# Patient Record
Sex: Male | Born: 1960 | Race: Black or African American | Hispanic: No | Marital: Single | Smoking: Never smoker
Health system: Southern US, Community
[De-identification: ages and names within clinical notes are randomized; demographics above are authoritative.]

## PROBLEM LIST (undated history)

## (undated) DIAGNOSIS — K259 Gastric ulcer, unspecified as acute or chronic, without hemorrhage or perforation: Secondary | ICD-10-CM

## (undated) DIAGNOSIS — I82409 Acute embolism and thrombosis of unspecified deep veins of unspecified lower extremity: Secondary | ICD-10-CM

## (undated) DIAGNOSIS — K219 Gastro-esophageal reflux disease without esophagitis: Secondary | ICD-10-CM

## (undated) DIAGNOSIS — R519 Headache, unspecified: Secondary | ICD-10-CM

## (undated) DIAGNOSIS — I10 Essential (primary) hypertension: Secondary | ICD-10-CM

## (undated) DIAGNOSIS — R51 Headache: Secondary | ICD-10-CM

## (undated) DIAGNOSIS — M1990 Unspecified osteoarthritis, unspecified site: Secondary | ICD-10-CM

## (undated) HISTORY — PX: KNEE ARTHROSCOPY: SUR90

---

## 2015-06-15 DIAGNOSIS — I82401 Acute embolism and thrombosis of unspecified deep veins of right lower extremity: Secondary | ICD-10-CM

## 2015-06-15 HISTORY — DX: Acute embolism and thrombosis of unspecified deep veins of right lower extremity: I82.401

## 2015-11-24 ENCOUNTER — Other Ambulatory Visit: Payer: Self-pay | Admitting: Physician Assistant

## 2015-11-26 NOTE — Patient Instructions (Addendum)
[name] [name]  11/26/2015   Your procedure is scheduled on: 12-05-15   [facility]  [facility] 3rd floor to  Short Stay Center at   1000 AM.  Call this number if you have problems the morning of surgery [phone]   Remember: ONLY 1 PERSON MAY GO WITH YOU TO SHORT STAY TO GET  READY MORNING OF YOUR SURGERY.  Do not eat food or drink liquids :After Midnight.     Take these medicines the morning of surgery with A SIP OF WATER: Pantoprazole. Pravastatin. Cetirizine. DO NOT TAKE ANY DIABETIC MEDICATIONS DAY OF YOUR SURGERY                               You may not have any metal on your body including hair pins and              piercings  Do not wear jewelry, make-up, lotions, powders or perfumes, deodorant             Do not wear nail polish.  Do not shave  48 hours prior to surgery.              Men may shave face and neck.   Do not bring valuables to the hospital. [facility] IS NOT             RESPONSIBLE   FOR VALUABLES.  Contacts, dentures or bridgework may not be worn into surgery.  Leave suitcase in the car. After surgery it may be brought to your room.     Patients discharged the day of surgery will not be allowed to drive home.  Name and phone number of your driver: Amelia JoVirgil [name]-father [phone] home  Special Instructions: N/A              Please read over the following fact sheets you were given: _____________________________________________________________________             [facility] - Preparing [facility] Before surgery, you can play an important role.  Because skin is not sterile, your skin needs to be as free of germs as possible.  You can reduce the number of germs on your skin by washing with CHG (chlorahexidine gluconate) soap before surgery.  CHG is an antiseptic cleaner which kills germs and bonds with the skin to continue killing germs even after washing. Please DO NOT use if you have an  allergy to CHG or antibacterial soaps.  If your skin becomes reddened/irritated stop using the CHG and inform your nurse when you arrive at Short Stay. Do not shave (including legs and underarms) for at least 48 hours prior to the first CHG shower.  You may shave your face/neck. Please follow these instructions carefully:  1.  Shower with CHG Soap the night before surgery and the  morning of Surgery.  2.  If you choose to wash your hair, wash your hair first as usual with your  normal  shampoo.  3.  After you shampoo, rinse your hair and body thoroughly to remove the  shampoo.                           4.  Use CHG as you would any other liquid soap.  You can apply chg directly  to the skin and wash                       Gently with a scrungie or clean washcloth.  5.  Apply the CHG Soap to your body ONLY FROM THE NECK DOWN.   Do not use on face/ open                           Wound or open sores. Avoid contact with eyes, ears mouth and genitals (private parts).                       Wash face,  Genitals (private parts) with your normal soap.             6.  Wash thoroughly, paying special attention to the area where your surgery  will be performed.  7.  Thoroughly rinse your body with warm water from the neck down.  8.  DO NOT shower/wash with your normal soap after using and rinsing off  the CHG Soap.                9.  Pat yourself dry with a clean towel.            10.  Wear clean pajamas.            11.  Place clean sheets on your bed the night of your first shower and do not  sleep with pets. Day of Surgery : Do not apply any lotions/deodorants the morning of surgery.  Please wear clean clothes to the hospital/surgery center.  FAILURE TO FOLLOW THESE INSTRUCTIONS MAY RESULT IN THE CANCELLATION OF YOUR SURGERY PATIENT SIGNATURE_________________________________  NURSE  SIGNATURE__________________________________  ________________________________________________________________________   [name]  An incentive spirometer is a tool that can help keep your lungs clear and active. This tool measures how well you are filling your lungs with each breath. Taking long deep breaths may help reverse or decrease the chance of developing breathing (pulmonary) problems (especially infection) following:  A long period of time when you are unable to move or be active. BEFORE THE PROCEDURE   If the spirometer includes an indicator to show your best effort, your nurse or respiratory therapist will set it to a desired goal.  If possible, sit up straight or lean slightly forward. Try not to slouch.  Hold the incentive spirometer in an upright position. INSTRUCTIONS FOR USE  1. Sit on the edge of your bed if possible, or sit up as far as you can in bed or on a chair. 2. Hold the incentive spirometer in an upright position. 3. Breathe out normally. 4. Place the mouthpiece in your mouth and seal your lips tightly around it. 5. Breathe in slowly and as deeply as possible, raising the piston or the ball toward the top of the column. 6. Hold your breath for 3-5 seconds or for as long as possible. Allow the piston or ball to fall to the bottom of the column. 7. Remove the mouthpiece from your mouth and breathe out normally. 8. Rest for a few seconds and repeat Steps 1 through 7 at least 10 times every 1-2 hours when you are awake. Take your time and take a few normal breaths between deep breaths. 9. The spirometer may include an indicator to show your best effort. Use the indicator as a goal to work toward during each repetition. 10. After each  set of 10 deep breaths, practice coughing to be sure your lungs are clear. If you have an incision (the cut made at the time of surgery), support your incision when coughing by placing a pillow or rolled up towels firmly  against it. Once you are able to get out of bed, walk around indoors and cough well. You may stop using the incentive spirometer when instructed by your caregiver.  RISKS AND COMPLICATIONS  Take your time so you do not get dizzy or light-headed.  If you are in pain, you may need to take or ask for pain medication before doing incentive spirometry. It is harder to take a deep breath if you are having pain. AFTER USE  Rest and breathe slowly and easily.  It can be helpful to keep track of a log of your progress. Your caregiver can provide you with a simple table to help with this. If you are using the spirometer at home, follow these instructions: [place] IF:   You are having difficultly using the spirometer.  You have trouble using the spirometer as often as instructed.  Your pain medication is not giving enough relief while using the spirometer.  You develop fever of 100.5 F (38.1 C) or higher. SEEK IMMEDIATE MEDICAL CARE IF:   You cough up bloody sputum that had not been present before.  You develop fever of 102 F (38.9 C) or greater.  You develop worsening pain at or near the incision site. MAKE SURE YOU:   Understand these instructions.  Will watch your condition.  Will get help right away if you are not doing well or get worse. Document Released: 10/11/2006 Document Revised: 08/23/2011 Document Reviewed: 12/12/2006 [facility] Patient Information 2014 [place], [place].   ________________________________________________________________________

## 2015-11-28 ENCOUNTER — Encounter (HOSPITAL_COMMUNITY)
Admission: RE | Admit: 2015-11-28 | Discharge: 2015-11-28 | Disposition: A | Discharge status: Home / Self Care | Payer: Managed Care, Other (non HMO) | Source: Ambulatory Visit | Attending: Orthopaedic Surgery | Admitting: Orthopaedic Surgery

## 2015-11-28 ENCOUNTER — Encounter (HOSPITAL_COMMUNITY): Payer: Self-pay

## 2015-11-28 DIAGNOSIS — Z0183 Encounter for blood typing: Secondary | ICD-10-CM | POA: Diagnosis not present

## 2015-11-28 DIAGNOSIS — M1611 Unilateral primary osteoarthritis, right hip: Secondary | ICD-10-CM | POA: Insufficient documentation

## 2015-11-28 DIAGNOSIS — I10 Essential (primary) hypertension: Secondary | ICD-10-CM | POA: Diagnosis not present

## 2015-11-28 DIAGNOSIS — Z01818 Encounter for other preprocedural examination: Secondary | ICD-10-CM | POA: Insufficient documentation

## 2015-11-28 DIAGNOSIS — Z01812 Encounter for preprocedural laboratory examination: Secondary | ICD-10-CM | POA: Diagnosis not present

## 2015-11-28 HISTORY — DX: Headache, unspecified: R51.9

## 2015-11-28 HISTORY — DX: Acute embolism and thrombosis of unspecified deep veins of unspecified lower extremity: I82.409

## 2015-11-28 HISTORY — DX: Essential (primary) hypertension: I10

## 2015-11-28 HISTORY — DX: Gastro-esophageal reflux disease without esophagitis: K21.9

## 2015-11-28 HISTORY — DX: Headache: R51

## 2015-11-28 HISTORY — DX: Gastric ulcer, unspecified as acute or chronic, without hemorrhage or perforation: K25.9

## 2015-11-28 HISTORY — DX: Unspecified osteoarthritis, unspecified site: M19.90

## 2015-11-28 LAB — BASIC METABOLIC PANEL
Anion gap: 6 (ref 5–15)
BUN: 11 mg/dL (ref 6–20)
CALCIUM: 9 mg/dL (ref 8.9–10.3)
CHLORIDE: 108 mmol/L (ref 101–111)
CO2: 25 mmol/L (ref 22–32)
CREATININE: 1.33 mg/dL — AB (ref 0.61–1.24)
GFR calc Af Amer: >60 mL/min (ref >60)
GFR calc non Af Amer: 60 mL/min — ABNORMAL LOW (ref >60)
Glucose, Bld: 96 mg/dL (ref 65–99)
Potassium: 4.2 mmol/L (ref 3.5–5.1)
SODIUM: 139 mmol/L (ref 135–145)

## 2015-11-28 LAB — SURGICAL PCR SCREEN
MRSA, PCR: NEGATIVE
Staphylococcus aureus: NEGATIVE

## 2015-11-28 LAB — CBC
HCT: 46 % (ref 39.0–52.0)
Hemoglobin: 15.8 g/dL (ref 13.0–17.0)
MCH: 27.1 pg (ref 26.0–34.0)
MCHC: 34.3 g/dL (ref 30.0–36.0)
MCV: 79 fL (ref 78.0–100.0)
PLATELETS: 200 10*3/uL (ref 150–400)
RBC: 5.82 MIL/uL — ABNORMAL HIGH (ref 4.22–5.81)
RDW: 14 % (ref 11.5–15.5)
WBC: 5 10*3/uL (ref 4.0–10.5)

## 2015-11-28 LAB — ABO/RH: ABO/RH(D): A POS

## 2015-11-28 NOTE — Pre-Procedure Instructions (Signed)
EKG done today.

## 2015-12-05 ENCOUNTER — Inpatient Hospital Stay (HOSPITAL_COMMUNITY): Payer: Managed Care, Other (non HMO)

## 2015-12-05 ENCOUNTER — Inpatient Hospital Stay (HOSPITAL_COMMUNITY): Payer: Managed Care, Other (non HMO) | Admitting: Registered Nurse

## 2015-12-05 ENCOUNTER — Encounter (HOSPITAL_COMMUNITY): Payer: Self-pay | Admitting: Anesthesiology

## 2015-12-05 ENCOUNTER — Encounter (HOSPITAL_COMMUNITY)
Admission: RE | Disposition: A | Discharge status: Home Health | Payer: Self-pay | Source: Ambulatory Visit | Attending: Orthopaedic Surgery

## 2015-12-05 ENCOUNTER — Inpatient Hospital Stay (HOSPITAL_COMMUNITY)
Admission: RE | Admit: 2015-12-05 | Discharge: 2015-12-06 | DRG: 470 | Disposition: A | Discharge status: Home Health | Payer: Managed Care, Other (non HMO) | Source: Ambulatory Visit | Attending: Orthopaedic Surgery | Admitting: Orthopaedic Surgery

## 2015-12-05 DIAGNOSIS — Z6835 Body mass index (BMI) 35.0-35.9, adult: Secondary | ICD-10-CM

## 2015-12-05 DIAGNOSIS — Z79899 Other long term (current) drug therapy: Secondary | ICD-10-CM

## 2015-12-05 DIAGNOSIS — M1611 Unilateral primary osteoarthritis, right hip: Secondary | ICD-10-CM | POA: Diagnosis present

## 2015-12-05 DIAGNOSIS — Z96641 Presence of right artificial hip joint: Secondary | ICD-10-CM

## 2015-12-05 DIAGNOSIS — K219 Gastro-esophageal reflux disease without esophagitis: Secondary | ICD-10-CM | POA: Diagnosis present

## 2015-12-05 DIAGNOSIS — Z8711 Personal history of peptic ulcer disease: Secondary | ICD-10-CM | POA: Diagnosis not present

## 2015-12-05 DIAGNOSIS — E669 Obesity, unspecified: Secondary | ICD-10-CM | POA: Diagnosis present

## 2015-12-05 DIAGNOSIS — I10 Essential (primary) hypertension: Secondary | ICD-10-CM | POA: Diagnosis present

## 2015-12-05 DIAGNOSIS — E785 Hyperlipidemia, unspecified: Secondary | ICD-10-CM | POA: Diagnosis present

## 2015-12-05 DIAGNOSIS — M25551 Pain in right hip: Secondary | ICD-10-CM | POA: Diagnosis present

## 2015-12-05 DIAGNOSIS — Z419 Encounter for procedure for purposes other than remedying health state, unspecified: Secondary | ICD-10-CM

## 2015-12-05 HISTORY — PX: TOTAL HIP ARTHROPLASTY: SHX124

## 2015-12-05 LAB — TYPE AND SCREEN
ABO/RH(D): A POS
Antibody Screen: NEGATIVE

## 2015-12-05 SURGERY — ARTHROPLASTY, HIP, TOTAL, ANTERIOR APPROACH
Anesthesia: Spinal | Site: Hip | Laterality: Right

## 2015-12-05 MED ORDER — METOCLOPRAMIDE HCL 5 MG/ML IJ SOLN: 5.0000 mg | Freq: Three times a day (TID) | INTRAMUSCULAR | Status: DC | PRN

## 2015-12-05 MED ORDER — ACETAMINOPHEN 325 MG PO TABS
650.0000 mg | ORAL_TABLET | Freq: Four times a day (QID) | ORAL | Status: DC | PRN
Start: 2015-12-05 — End: 2015-12-06
  Administered 2015-12-05: 650 mg via ORAL
  Filled 2015-12-05: qty 2

## 2015-12-05 MED ORDER — PRAVASTATIN SODIUM 20 MG PO TABS
40.0000 mg | ORAL_TABLET | Freq: Every day | ORAL | Status: DC
  Administered 2015-12-06: 40 mg via ORAL
  Filled 2015-12-05: qty 2

## 2015-12-05 MED ORDER — MENTHOL 3 MG MT LOZG: 1.0000 | LOZENGE | OROMUCOSAL | Status: DC | PRN

## 2015-12-05 MED ORDER — METHOCARBAMOL 1000 MG/10ML IJ SOLN
500.0000 mg | Freq: Four times a day (QID) | INTRAVENOUS | Status: DC | PRN
  Administered 2015-12-05: 500 mg via INTRAVENOUS
  Filled 2015-12-05: qty 5
  Filled 2015-12-05: qty 550

## 2015-12-05 MED ORDER — DEXAMETHASONE SODIUM PHOSPHATE 10 MG/ML IJ SOLN
INTRAMUSCULAR | Status: AC
  Filled 2015-12-05: qty 1

## 2015-12-05 MED ORDER — CEFAZOLIN SODIUM-DEXTROSE 2-4 GM/100ML-% IV SOLN
INTRAVENOUS | Status: AC
  Filled 2015-12-05: qty 100

## 2015-12-05 MED ORDER — SODIUM CHLORIDE 0.9 % IV SOLN
INTRAVENOUS | Status: DC
  Administered 2015-12-05: 17:00:00 via INTRAVENOUS

## 2015-12-05 MED ORDER — PROPOFOL 10 MG/ML IV BOLUS
INTRAVENOUS | Status: AC
  Filled 2015-12-05: qty 20

## 2015-12-05 MED ORDER — CEFAZOLIN SODIUM-DEXTROSE 2-4 GM/100ML-% IV SOLN
2.0000 g | INTRAVENOUS | Status: AC
  Administered 2015-12-05: 2 g via INTRAVENOUS
  Filled 2015-12-05: qty 100

## 2015-12-05 MED ORDER — KETOROLAC TROMETHAMINE 30 MG/ML IJ SOLN
30.0000 mg | Freq: Once | INTRAMUSCULAR | Status: AC
Start: 2015-12-05 — End: 2015-12-05
  Administered 2015-12-05: 30 mg via INTRAVENOUS
  Filled 2015-12-05: qty 1

## 2015-12-05 MED ORDER — PHENYLEPHRINE HCL 10 MG/ML IJ SOLN
20.0000 mg | INTRAVENOUS | Status: DC | PRN
  Administered 2015-12-05: 15 ug/min via INTRAVENOUS

## 2015-12-05 MED ORDER — CELECOXIB 200 MG PO CAPS
200.0000 mg | ORAL_CAPSULE | Freq: Two times a day (BID) | ORAL | Status: DC
  Administered 2015-12-05 – 2015-12-06 (×2): 200 mg via ORAL
  Filled 2015-12-05 (×2): qty 1

## 2015-12-05 MED ORDER — METOCLOPRAMIDE HCL 5 MG PO TABS: 5.0000 mg | ORAL_TABLET | Freq: Three times a day (TID) | ORAL | Status: DC | PRN

## 2015-12-05 MED ORDER — ONDANSETRON HCL 4 MG/2ML IJ SOLN: 4.0000 mg | Freq: Once | INTRAMUSCULAR | Status: DC | PRN

## 2015-12-05 MED ORDER — ALUM & MAG HYDROXIDE-SIMETH 200-200-20 MG/5ML PO SUSP: 30.0000 mL | ORAL | Status: DC | PRN

## 2015-12-05 MED ORDER — OXYCODONE HCL 5 MG PO TABS
5.0000 mg | ORAL_TABLET | ORAL | Status: DC | PRN
  Administered 2015-12-05 – 2015-12-06 (×3): 10 mg via ORAL
  Filled 2015-12-05 (×3): qty 2

## 2015-12-05 MED ORDER — RIVAROXABAN 10 MG PO TABS
10.0000 mg | ORAL_TABLET | Freq: Every day | ORAL | Status: DC
  Administered 2015-12-06: 10 mg via ORAL
  Filled 2015-12-05: qty 1

## 2015-12-05 MED ORDER — CEFAZOLIN IN D5W 1 GM/50ML IV SOLN
1.0000 g | Freq: Four times a day (QID) | INTRAVENOUS | Status: AC
  Administered 2015-12-05 – 2015-12-06 (×2): 1 g via INTRAVENOUS
  Filled 2015-12-05 (×2): qty 50

## 2015-12-05 MED ORDER — STERILE WATER FOR IRRIGATION IR SOLN
Status: DC | PRN
  Administered 2015-12-05: 2000 mL

## 2015-12-05 MED ORDER — ONDANSETRON HCL 4 MG/2ML IJ SOLN
4.0000 mg | Freq: Four times a day (QID) | INTRAMUSCULAR | Status: DC | PRN
Start: 2015-12-05 — End: 2015-12-06

## 2015-12-05 MED ORDER — HYDROMORPHONE HCL 1 MG/ML IJ SOLN
1.0000 mg | INTRAMUSCULAR | Status: DC | PRN
  Administered 2015-12-05: 1 mg via INTRAVENOUS
  Filled 2015-12-05: qty 1

## 2015-12-05 MED ORDER — FENTANYL CITRATE (PF) 100 MCG/2ML IJ SOLN
INTRAMUSCULAR | Status: DC | PRN
  Administered 2015-12-05: 50 ug via INTRAVENOUS

## 2015-12-05 MED ORDER — PHENOL 1.4 % MT LIQD: 1.0000 | OROMUCOSAL | Status: DC | PRN

## 2015-12-05 MED ORDER — DOCUSATE SODIUM 100 MG PO CAPS
100.0000 mg | ORAL_CAPSULE | Freq: Two times a day (BID) | ORAL | Status: DC
  Administered 2015-12-05 – 2015-12-06 (×3): 100 mg via ORAL
  Filled 2015-12-05 (×3): qty 1

## 2015-12-05 MED ORDER — SODIUM CHLORIDE 0.9 % IR SOLN
Status: DC | PRN
  Administered 2015-12-05: 1000 mL

## 2015-12-05 MED ORDER — DIPHENHYDRAMINE HCL 12.5 MG/5ML PO ELIX: 12.5000 mg | ORAL_SOLUTION | ORAL | Status: DC | PRN

## 2015-12-05 MED ORDER — LIDOCAINE HCL (CARDIAC) 20 MG/ML IV SOLN
INTRAVENOUS | Status: DC | PRN
  Administered 2015-12-05: 100 mg via INTRAVENOUS

## 2015-12-05 MED ORDER — LIDOCAINE HCL (CARDIAC) 20 MG/ML IV SOLN
INTRAVENOUS | Status: AC
  Filled 2015-12-05: qty 5

## 2015-12-05 MED ORDER — BUPIVACAINE IN DEXTROSE 0.75-8.25 % IT SOLN
INTRATHECAL | Status: DC | PRN
  Administered 2015-12-05: 2 mL via INTRATHECAL

## 2015-12-05 MED ORDER — METHOCARBAMOL 500 MG PO TABS
500.0000 mg | ORAL_TABLET | Freq: Four times a day (QID) | ORAL | Status: DC | PRN
  Administered 2015-12-05: 500 mg via ORAL
  Filled 2015-12-05: qty 1

## 2015-12-05 MED ORDER — DEXAMETHASONE SODIUM PHOSPHATE 10 MG/ML IJ SOLN
INTRAMUSCULAR | Status: DC | PRN
Start: 2015-12-05 — End: 2015-12-05
  Administered 2015-12-05: 10 mg via INTRAVENOUS

## 2015-12-05 MED ORDER — EPHEDRINE SULFATE 50 MG/ML IJ SOLN
INTRAMUSCULAR | Status: DC | PRN
  Administered 2015-12-05 (×2): 5 mg via INTRAVENOUS

## 2015-12-05 MED ORDER — LACTATED RINGERS IV SOLN
INTRAVENOUS | Status: DC
  Administered 2015-12-05: 11:00:00 via INTRAVENOUS

## 2015-12-05 MED ORDER — PHENYLEPHRINE HCL 10 MG/ML IJ SOLN
INTRAMUSCULAR | Status: AC
  Filled 2015-12-05: qty 2

## 2015-12-05 MED ORDER — PROPOFOL 500 MG/50ML IV EMUL
INTRAVENOUS | Status: DC | PRN
  Administered 2015-12-05: 40 ug/kg/min via INTRAVENOUS

## 2015-12-05 MED ORDER — ZOLPIDEM TARTRATE 5 MG PO TABS: 5.0000 mg | ORAL_TABLET | Freq: Every evening | ORAL | Status: DC | PRN

## 2015-12-05 MED ORDER — PHENYLEPHRINE HCL 10 MG/ML IJ SOLN
INTRAMUSCULAR | Status: DC | PRN
  Administered 2015-12-05 (×3): 80 ug via INTRAVENOUS

## 2015-12-05 MED ORDER — PROPOFOL 10 MG/ML IV BOLUS
INTRAVENOUS | Status: AC
  Filled 2015-12-05: qty 40

## 2015-12-05 MED ORDER — FENTANYL CITRATE (PF) 100 MCG/2ML IJ SOLN
INTRAMUSCULAR | Status: AC
  Filled 2015-12-05: qty 2

## 2015-12-05 MED ORDER — LOSARTAN POTASSIUM 25 MG PO TABS
25.0000 mg | ORAL_TABLET | Freq: Every day | ORAL | Status: DC
  Administered 2015-12-06: 25 mg via ORAL
  Filled 2015-12-05 (×3): qty 1

## 2015-12-05 MED ORDER — ONDANSETRON HCL 4 MG/2ML IJ SOLN
INTRAMUSCULAR | Status: AC
  Filled 2015-12-05: qty 2

## 2015-12-05 MED ORDER — PANTOPRAZOLE SODIUM 40 MG PO TBEC
40.0000 mg | DELAYED_RELEASE_TABLET | Freq: Every day | ORAL | Status: DC
  Administered 2015-12-06: 40 mg via ORAL
  Filled 2015-12-05: qty 1

## 2015-12-05 MED ORDER — CHLORHEXIDINE GLUCONATE 4 % EX LIQD: 60.0000 mL | Freq: Once | CUTANEOUS | Status: DC

## 2015-12-05 MED ORDER — ONDANSETRON HCL 4 MG PO TABS: 4.0000 mg | ORAL_TABLET | Freq: Four times a day (QID) | ORAL | Status: DC | PRN

## 2015-12-05 MED ORDER — MIDAZOLAM HCL 5 MG/5ML IJ SOLN
INTRAMUSCULAR | Status: DC | PRN
  Administered 2015-12-05: 2 mg via INTRAVENOUS

## 2015-12-05 MED ORDER — OXYCODONE HCL 5 MG PO TABS
5.0000 mg | ORAL_TABLET | ORAL | Status: DC | PRN
  Administered 2015-12-05: 10 mg via ORAL
  Filled 2015-12-05: qty 2

## 2015-12-05 MED ORDER — ACETAMINOPHEN 650 MG RE SUPP: 650.0000 mg | Freq: Four times a day (QID) | RECTAL | Status: DC | PRN

## 2015-12-05 MED ORDER — MIDAZOLAM HCL 2 MG/2ML IJ SOLN
INTRAMUSCULAR | Status: AC
  Filled 2015-12-05: qty 2

## 2015-12-05 MED ORDER — ONDANSETRON HCL 4 MG/2ML IJ SOLN
INTRAMUSCULAR | Status: DC | PRN
  Administered 2015-12-05: 4 mg via INTRAVENOUS

## 2015-12-05 MED ORDER — HYDROMORPHONE HCL 1 MG/ML IJ SOLN: 0.2500 mg | INTRAMUSCULAR | Status: DC | PRN

## 2015-12-05 MED ORDER — PHENYLEPHRINE 40 MCG/ML (10ML) SYRINGE FOR IV PUSH (FOR BLOOD PRESSURE SUPPORT)
PREFILLED_SYRINGE | INTRAVENOUS | Status: AC
  Filled 2015-12-05: qty 10

## 2015-12-05 MED ORDER — MEPERIDINE HCL 50 MG/ML IJ SOLN: 6.2500 mg | INTRAMUSCULAR | Status: DC | PRN

## 2015-12-05 SURGICAL SUPPLY — 41 items
BAG ZIPLOCK 12X15 (MISCELLANEOUS) ×3 IMPLANT
BENZOIN TINCTURE PRP APPL 2/3 (GAUZE/BANDAGES/DRESSINGS) ×3 IMPLANT
BLADE SAW SGTL 18X1.27X75 (BLADE) ×2 IMPLANT
BLADE SAW SGTL 18X1.27X75MM (BLADE) ×1
CAPT HIP TOTAL 2 ×3 IMPLANT
CELLS DAT CNTRL 66122 CELL SVR (MISCELLANEOUS) ×1 IMPLANT
CLOSURE WOUND 1/2 X4 (GAUZE/BANDAGES/DRESSINGS) ×1
CLOTH BEACON ORANGE TIMEOUT ST (SAFETY) ×3 IMPLANT
DRAPE STERI IOBAN 125X83 (DRAPES) ×3 IMPLANT
DRAPE U-SHAPE 47X51 STRL (DRAPES) ×6 IMPLANT
DRESSING AQUACEL AG SP 3.5X10 (GAUZE/BANDAGES/DRESSINGS) ×1 IMPLANT
DRSG AQUACEL AG ADV 3.5X10 (GAUZE/BANDAGES/DRESSINGS) ×3 IMPLANT
DRSG AQUACEL AG SP 3.5X10 (GAUZE/BANDAGES/DRESSINGS) ×3
DURAPREP 26ML APPLICATOR (WOUND CARE) ×3 IMPLANT
ELECT REM PT RETURN 9FT ADLT (ELECTROSURGICAL) ×3
ELECTRODE REM PT RTRN 9FT ADLT (ELECTROSURGICAL) ×1 IMPLANT
GAUZE XEROFORM 1X8 LF (GAUZE/BANDAGES/DRESSINGS) IMPLANT
GLOVE BIO SURGEON STRL SZ7.5 (GLOVE) ×3 IMPLANT
GLOVE BIOGEL PI IND STRL 7.5 (GLOVE) ×4 IMPLANT
GLOVE BIOGEL PI IND STRL 8 (GLOVE) ×2 IMPLANT
GLOVE BIOGEL PI INDICATOR 7.5 (GLOVE) ×8
GLOVE BIOGEL PI INDICATOR 8 (GLOVE) ×4
GLOVE ECLIPSE 8.0 STRL XLNG CF (GLOVE) ×3 IMPLANT
GLOVE SURG SS PI 7.5 STRL IVOR (GLOVE) ×6 IMPLANT
GOWN STRL REUS W/ TWL XL LVL3 (GOWN DISPOSABLE) ×1 IMPLANT
GOWN STRL REUS W/TWL XL LVL3 (GOWN DISPOSABLE) ×11 IMPLANT
HANDPIECE INTERPULSE COAX TIP (DISPOSABLE) ×2
HOLDER FOLEY CATH W/STRAP (MISCELLANEOUS) ×3 IMPLANT
PACK ANTERIOR HIP CUSTOM (KITS) ×3 IMPLANT
RTRCTR WOUND ALEXIS 18CM MED (MISCELLANEOUS) ×3
SET HNDPC FAN SPRY TIP SCT (DISPOSABLE) ×1 IMPLANT
STAPLER VISISTAT 35W (STAPLE) IMPLANT
STRIP CLOSURE SKIN 1/2X4 (GAUZE/BANDAGES/DRESSINGS) ×2 IMPLANT
SUT ETHIBOND NAB CT1 #1 30IN (SUTURE) ×3 IMPLANT
SUT MNCRL AB 4-0 PS2 18 (SUTURE) ×3 IMPLANT
SUT VIC AB 0 CT1 36 (SUTURE) ×3 IMPLANT
SUT VIC AB 1 CT1 36 (SUTURE) ×3 IMPLANT
SUT VIC AB 2-0 CT1 27 (SUTURE) ×4
SUT VIC AB 2-0 CT1 TAPERPNT 27 (SUTURE) ×2 IMPLANT
TRAY CATH 16FR W/PLASTIC CATH (SET/KITS/TRAYS/PACK) ×3 IMPLANT
YANKAUER SUCT BULB TIP NO VENT (SUCTIONS) ×3 IMPLANT

## 2015-12-05 NOTE — Anesthesia Postprocedure Evaluation (Addendum)
Anesthesia Post Note  Patient: [name] DandyGregory [name]  Procedure(s) Performed: Procedure(s) (LRB): RIGHT TOTAL HIP ARTHROPLASTY ANTERIOR APPROACH (Right)  Patient location during evaluation: PACU Anesthesia Type: Spinal Level of consciousness: awake and alert and oriented Pain management: pain level controlled Vital Signs Assessment: post-procedure vital signs reviewed and stable Respiratory status: spontaneous breathing, nonlabored ventilation and respiratory function stable Cardiovascular status: blood pressure returned to baseline and stable Postop Assessment: no signs of nausea or vomiting, no headache, no backache, spinal receding and patient able to bend at knees Anesthetic complications: no    Last Vitals:  Filed Vitals:   12/05/15 1445 12/05/15 1500  BP: 108/73 106/71  Pulse: 73 77  Temp:  36.7 C  Resp: 12 15    Last Pain:  Filed Vitals:   12/05/15 1513  PainSc: Asleep                 [name]

## 2015-12-05 NOTE — Brief Op Note (Signed)
12/05/2015  1:16 PM  PATIENT:  [name] [name]  55 y.o. male  PRE-OPERATIVE DIAGNOSIS:  severe osteoarthritis right hip  POST-OPERATIVE DIAGNOSIS:  severe osteoarthritis right hip  PROCEDURE:  Procedure(s): RIGHT TOTAL HIP ARTHROPLASTY ANTERIOR APPROACH (Right)  SURGEON:  Surgeon(s) and Role:    * Kathryne Hitchhristopher Y Alvah Gilder, MD - Primary  PHYSICIAN ASSISTANT: Rexene EdisonGil Clark, PA-C  ANESTHESIA:   spinal  EBL:  Total I/O In: 1000 [I.V.:1000] Out: 325 [Blood:325]  COUNTS:  YES  DICTATION: .Other Dictation: Dictation Number [id number]  PLAN OF CARE: Admit to inpatient   PATIENT DISPOSITION:  PACU - hemodynamically stable.   Delay start of Pharmacological VTE agent (>24hrs) due to surgical blood loss or risk of bleeding: no

## 2015-12-05 NOTE — H&P (Signed)
TOTAL HIP ADMISSION H&P  Patient is admitted for right total hip arthroplasty.  Subjective:  Chief Complaint: right hip pain  HPI: [name] [name], 55 y.o. male, has a history of pain and functional disability in the right hip(s) due to arthritis and patient has failed non-surgical conservative treatments for greater than 12 weeks to include NSAID's and/or analgesics, flexibility and strengthening excercises, weight reduction as appropriate and activity modification.  Onset of symptoms was gradual starting 1 years ago with gradually worsening course since that time.The patient noted no past surgery on the right hip(s).  Patient currently rates pain in the right hip at 7 out of 10 with activity. Patient has night pain, worsening of pain with activity and weight bearing, pain that interfers with activities of daily living, pain with passive range of motion and crepitus. Patient has evidence of subchondral cysts, subchondral sclerosis, periarticular osteophytes and joint space narrowing by imaging studies. This condition presents safety issues increasing the risk of falls.  There is no current active infection.  Patient Active Problem List   Diagnosis Date Noted  . Osteoarthritis of right hip 12/05/2015   Past Medical History  Diagnosis Date  . Hypertension   . DVT (deep venous thrombosis) (HCC)     right thigh s/p knee scope surgery  . GERD (gastroesophageal reflux disease)     Protonix controls  . Headache     migraines periodically- once to twice monthly to none in a period of time.  . Arthritis     osteoarthritis-hips, knees  . Gastric ulcer     history of many yrs ago.    Past Surgical History  Procedure Laterality Date  . Knee arthroscopy Right     knee scope surgery    No prescriptions prior to admission   No Known Allergies  Social History  Substance Use Topics  . Smoking status: Never Smoker   . Smokeless tobacco: Never Used  . Alcohol Use: No    No family history on  file.   Review of Systems  Musculoskeletal: Positive for joint pain.  All other systems reviewed and are negative.   Objective:  Physical Exam  Constitutional: He is oriented to person, place, and time. He appears well-developed and well-nourished.  HENT:  Head: Normocephalic and atraumatic.  Eyes: EOM are normal. Pupils are equal, round, and reactive to light.  Neck: Normal range of motion. Neck supple.  Cardiovascular: Normal rate and regular rhythm.   Respiratory: Effort normal and breath sounds normal.  GI: Soft. Bowel sounds are normal.  Musculoskeletal:       Right hip: He exhibits decreased range of motion, decreased strength, tenderness and bony tenderness.  Neurological: He is alert and oriented to person, place, and time.  Skin: Skin is warm and dry.  Psychiatric: He has a normal mood and affect.    Vital signs in last 24 hours:    Labs:   There is no height or weight on file to calculate BMI.   Imaging Review Plain radiographs demonstrate severe degenerative joint disease of the right hip(s). The bone quality appears to be excellent for age and reported activity level.  Assessment/Plan:  End stage arthritis, right hip(s)  The patient history, physical examination, clinical judgement of the provider and imaging studies are consistent with end stage degenerative joint disease of the right hip(s) and total hip arthroplasty is deemed medically necessary. The treatment options including medical management, injection therapy, arthroscopy and arthroplasty were discussed at length. The risks and benefits  of total hip arthroplasty were presented and reviewed. The risks due to aseptic loosening, infection, stiffness, dislocation/subluxation,  thromboembolic complications and other imponderables were discussed.  The patient acknowledged the explanation, agreed to proceed with the plan and consent was signed. Patient is being admitted for inpatient treatment for surgery, pain  control, PT, OT, prophylactic antibiotics, VTE prophylaxis, progressive ambulation and ADL's and discharge planning.The patient is planning to be discharged home with home health services

## 2015-12-05 NOTE — Transfer of Care (Signed)
Immediate Anesthesia Transfer of Care Note  Patient: [name] [name]  Procedure(s) Performed: Procedure(s): RIGHT TOTAL HIP ARTHROPLASTY ANTERIOR APPROACH (Right)  Patient Location: PACU  Anesthesia Type:MAC and Spinal  Level of Consciousness: awake, alert , oriented and patient cooperative  Airway & Oxygen Therapy: Patient Spontanous Breathing and Patient connected to face mask oxygen  Post-op Assessment: Report given to RN and Post -op Vital signs reviewed and stable  Post vital signs: Reviewed and stable  Last Vitals:  Filed Vitals:   12/05/15 1015  BP: 133/95  Pulse: 74  Temp: 36.8 C  Resp: 18    Last Pain:  Filed Vitals:   12/05/15 1041  PainSc: 0-No pain      Patients Stated Pain Goal: 2 (12/05/15 1040)  Complications: No apparent anesthesia complications

## 2015-12-05 NOTE — Op Note (Signed)
[name]:  [name] [name], [name] [name]             ACCOUNT NO.:  [id number]  MEDICAL RECORD NO.:  [id number]  LOCATION:  [facility]                         [facility]  PHYSICIAN:  Vanita PandaChristopher Y. Magnus IvanBlackman, M.D.DATE OF BIRTH:  05-19-1961  DATE OF PROCEDURE:  12/05/2015 DATE OF DISCHARGE:                              OPERATIVE REPORT   PREOPERATIVE DIAGNOSIS:  Primary osteoarthritis and degenerative joint disease of right hip.  POSTOPERATIVE DIAGNOSIS:  Primary osteoarthritis and degenerative joint disease of right hip.  PROCEDURE:  Right total hip arthroplasty through direct anterior approach.  IMPLANTS:  DePuy Sector Gription acetabular component size 56, size 36+ 4 polyethylene liner, size 13 Corail femoral component with varus offset, size 36+ 1.5 ceramic hip ball.  SURGEON:  Vanita PandaChristopher Y. Magnus IvanBlackman, M.D.  ASSISTANT:  Richardean CanalGilbert Clark, PA-C.  ANESTHESIA:  Spinal.  ANTIBIOTICS:  2 g of IV Ancef.  BLOOD LOSS:  About 300 mL.  COMPLICATIONS:  None.  INDICATIONS:  Mr. [name] [name] is a 55 year old gentleman well known to me. He has bilateral hip osteoarthritis, but his right hurting much worse than his left.  He has x-rays that showed complete loss of superolateral joint space, varus deformity of both femoral acetabular impingement, sclerotic changes and some slight cystic changes.  He has tried and failed all forms of conservative treatment.  Now, his activities of daily living, mobility and quality of life have all been detrimentally affected by his hip disease.  At this point, he does wish to proceed with a total hip arthroplasty.  He understands the risks of acute blood loss anemia, nerve and vessel injury, fracture, infection, dislocation, DVT.  He understands our goals are decreased pain, improved mobility, and overall improved quality of life.  PROCEDURE DESCRIPTION:  After informed consent was obtained, appropriate right hip was marked.  He was brought to the operating room where  spinal anesthesia was obtained while he was on the stretcher.  Traction boots were placed on both of his feet.  Next, he was placed supine on the Hana fracture table with the perineal post in place and both legs in inline skeletal traction devices, but no traction applied.  His right operative hip was then prepped and draped with DuraPrep and sterile drapes.  A time-out was called.  He was identified as the correct patient and correct right hip.  We then made an incision inferior and posterior to the anterior superior iliac spine and carried this only slightly obliquely down the leg.  We dissected down the tensor fascia lata muscle and tensor fascia was then identified and divided longitudinally, so we could proceed with a direct anterior approach to the hip.  We identified and cauterized the circumflex vessels and then identified the hip capsule, placing Cobra retractors around the lateral, medial, femoral neck.  We then opened up the hip capsule in L-type format, finding a large joint effusion and significant periarticular osteophytes.  We placed Cobra retractors within the hip capsule and then made our femoral neck cut with an oscillating saw proximal to the lesser trochanter and completed this with an osteotome.  I placed a corkscrew guide in the femoral head and removed the femoral head in its entirety and found it to be  completely devoid of cartilage.  We then cleaned the acetabulum and remnants of the acetabular labrum.  I released the transverse acetabular ligament and then placed a bent Hohmann across the medial acetabular rim.  We then began reaming under direct visualization from a size 42 reamer up to a size 56 with all reamers under direct visualization and the last reamer under direct fluoroscopy, so I could obtain my depth of reaming, our inclination, and anteversion.  Once I was pleased with this, I placed the real DePuy Sector Gription acetabular component size 56 and a  36+ 4 polyethylene liner for that size acetabular component.  Attention was then turned to the femur. With the leg externally rotated to 120 degrees, extended and adducted, we were able to place a Mueller retractor medially and a Hohmann retractor behind the greater trochanter.  I released the lateral joint capsule and used a box cutting osteotome to enter the femoral canal and a rongeur to lateralize.  I then began broaching from a size 8 broach using the Corail broaching system up to a size 13.  With a size 13, we trialed a varus offset femoral neck based off his anatomy and a 36+ 1.5 trial hip ball.  We reduced this in the acetabulum and it was stable. We were pleased with leg length, offset and range of motion.  We then dislocated the hip and removed the trial components.  We were able to place the real Corail femoral component with varus offset size 13 and the real 36+ 1.5 ceramic hip ball.  We then reduced this in the acetabulum and we were pleased with stability, leg length and offset. We then irrigated the soft tissue with normal saline solution using pulsatile lavage.  We were able to close the joint capsule with interrupted #1 Ethibond suture followed by running #1 Vicryl in the tensor fascia, 0 Vicryl in the deep tissue, 2-0 Vicryl in the subcutaneous tissue, 4-0 Monocryl in subcuticular stitch and Steri- Strips on the skin.  An Aquacel dressing was applied.  He was taken off the Hana table and taken to the recovery room in stable condition.  All final counts were correct.  There were no complications noted.  Of note, Richardean CanalGilbert Clark, PA-C assisted entire case.  His assistance was crucial for facilitating all aspects of this case.     Vanita Pandahristopher Y. Magnus IvanBlackman, M.D.     CYB/MEDQ  D:  12/05/2015  T:  12/05/2015  Job:  [id number]

## 2015-12-05 NOTE — Anesthesia Preprocedure Evaluation (Addendum)
Anesthesia Evaluation  Patient identified by MRN, date of birth, ID band Patient awake    Reviewed: Allergy & Precautions, NPO status , Patient's Chart, lab work & pertinent test results  Airway Mallampati: I  TM Distance: >3 FB Neck ROM: Full    Dental no notable dental hx. (+) Caps, Teeth Intact   Pulmonary neg pulmonary ROS,    Pulmonary exam normal breath sounds clear to auscultation       Cardiovascular hypertension, Pt. on medications Normal cardiovascular exam Rhythm:Regular Rate:Normal     Neuro/Psych  Headaches, negative psych ROS   GI/Hepatic Neg liver ROS, PUD, GERD  Medicated and Controlled,  Endo/Other  Obesity Hyperlipidemia  Renal/GU negative Renal ROS  negative genitourinary   Musculoskeletal  (+) Arthritis , Osteoarthritis,  Severe OA right hip   Abdominal (+) + obese,   Peds  Hematology negative hematology ROS (+)   Anesthesia Other Findings   Reproductive/Obstetrics                           Anesthesia Physical Anesthesia Plan  ASA: II  Anesthesia Plan: Spinal   Post-op Pain Management:    Induction:   Airway Management Planned: Natural Airway, Nasal Cannula and Simple Face Mask  Additional Equipment:   Intra-op Plan:   Post-operative Plan:   Informed Consent: I have reviewed the patients History and Physical, chart, labs and discussed the procedure including the risks, benefits and alternatives for the proposed anesthesia with the patient or authorized representative who has indicated his/her understanding and acceptance.   Dental advisory given  Plan Discussed with: Anesthesiologist, CRNA and Surgeon  Anesthesia Plan Comments:        Anesthesia Quick Evaluation

## 2015-12-05 NOTE — Anesthesia Procedure Notes (Addendum)
Spinal  Start time: 12/05/2015 11:50 AM End time: 12/05/2015 11:56 AM Staffing Anesthesiologist: Josephine Igo Resident/CRNA: Carleene Cooper A Performed by: anesthesiologist and resident/CRNA  Preanesthetic Checklist Completed: patient identified, site marked, surgical consent, pre-op evaluation, timeout performed, IV checked, risks and benefits discussed and monitors and equipment checked Spinal Block Patient position: sitting Prep: ChloraPrep and site prepped and draped Patient monitoring: heart rate, blood pressure and continuous pulse ox Approach: midline Location: L4-5 Injection technique: single-shot Needle Needle type: Pencan  Needle gauge: 24 G Needle length: 10 cm Needle insertion depth: 6 cm Assessment Sensory level: T4 Additional Notes Spinal kit expiration date  Checked and verified. +CSF, -heme with one attempt. Pt tolerated well. Adequate sensory level.

## 2015-12-06 LAB — CBC
HEMATOCRIT: 38.5 % — AB (ref 39.0–52.0)
Hemoglobin: 13.2 g/dL (ref 13.0–17.0)
MCH: 27 pg (ref 26.0–34.0)
MCHC: 34.3 g/dL (ref 30.0–36.0)
MCV: 78.9 fL (ref 78.0–100.0)
PLATELETS: 189 10*3/uL (ref 150–400)
RBC: 4.88 MIL/uL (ref 4.22–5.81)
RDW: 13.9 % (ref 11.5–15.5)
WBC: 14.9 10*3/uL — AB (ref 4.0–10.5)

## 2015-12-06 LAB — BASIC METABOLIC PANEL
ANION GAP: 4 — AB (ref 5–15)
BUN: 13 mg/dL (ref 6–20)
CALCIUM: 8.5 mg/dL — AB (ref 8.9–10.3)
CO2: 25 mmol/L (ref 22–32)
CREATININE: 1.46 mg/dL — AB (ref 0.61–1.24)
Chloride: 111 mmol/L (ref 101–111)
GFR calc Af Amer: >60 mL/min (ref >60)
GFR, EST NON AFRICAN AMERICAN: 53 mL/min — AB (ref >60)
GLUCOSE: 141 mg/dL — AB (ref 65–99)
Potassium: 4.6 mmol/L (ref 3.5–5.1)
Sodium: 140 mmol/L (ref 135–145)

## 2015-12-06 MED ORDER — RIVAROXABAN 10 MG PO TABS: 10.0000 mg | ORAL_TABLET | Freq: Every day | ORAL | Status: DC

## 2015-12-06 MED ORDER — OXYCODONE-ACETAMINOPHEN 5-325 MG PO TABS: 1.0000 | ORAL_TABLET | ORAL | Status: DC | PRN

## 2015-12-06 MED ORDER — CYCLOBENZAPRINE HCL 10 MG PO TABS: 10.0000 mg | ORAL_TABLET | Freq: Three times a day (TID) | ORAL | Status: AC | PRN

## 2015-12-06 NOTE — Progress Notes (Signed)
Assessment unchanged. Pt verbalized understanding of dc instructions through teach back including follow up care and when to call the doctor. Scripts given as provided by MD. Discharged via wc to front entrance by NT, father and brother.

## 2015-12-06 NOTE — Progress Notes (Signed)
Subjective: 1 Day Post-Op Procedure(s) (LRB): RIGHT TOTAL HIP ARTHROPLASTY ANTERIOR APPROACH (Right) Patient reports pain as moderate.    Objective: Vital signs in last 24 hours: Temp:  [97.7 F (36.5 C)-98.7 F (37.1 C)] 98.1 F (36.7 C) (06/24 0445) Pulse Rate:  [72-93] 77 (06/24 0445) Resp:  [12-18] 18 (06/24 0445) BP: (88-133)/(60-95) 110/75 mmHg (06/24 0445) SpO2:  [97 %-100 %] 100 % (06/24 0445) Weight:  [97.977 kg (216 lb)] 97.977 kg (216 lb) (06/23 1533)  Intake/Output from previous day: 06/23 0701 - 06/24 0700 In: 5271.3 [P.O.:1560; I.V.:3561.3; IV Piggyback:150] Out: 2075 [Urine:1750; Blood:325] Intake/Output this shift:     Recent Labs  12/06/15 0410  HGB 13.2    Recent Labs  12/06/15 0410  WBC 14.9*  RBC 4.88  HCT 38.5*  PLT 189    Recent Labs  12/06/15 0410  NA 140  K 4.6  CL 111  CO2 25  BUN 13  CREATININE 1.46*  GLUCOSE 141*  CALCIUM 8.5*   No results for input(s): LABPT, INR in the last 72 hours.  Sensation intact distally Intact pulses distally Dorsiflexion/Plantar flexion intact Incision: no drainage No cellulitis present Compartment soft  Assessment/Plan: 1 Day Post-Op Procedure(s) (LRB): RIGHT TOTAL HIP ARTHROPLASTY ANTERIOR APPROACH (Right) Up with therapy Discharge home with home health today vs tomorrow.  Barry Culverhouse Y 12/06/2015, 7:20 AM

## 2015-12-06 NOTE — Discharge Instructions (Signed)
INSTRUCTIONS AFTER JOINT REPLACEMENT  ° °o Remove items at home which could result in a fall. This includes throw rugs or furniture in walking pathways °o ICE to the affected joint every three hours while awake for 30 minutes at a time, for at least the first 3-5 days, and then as needed for pain and swelling.  Continue to use ice for pain and swelling. You may notice swelling that will progress down to the foot and ankle.  This is normal after surgery.  Elevate your leg when you are not up walking on it.   °o Continue to use the breathing machine you got in the hospital (incentive spirometer) which will help keep your temperature down.  It is common for your temperature to cycle up and down following surgery, especially at night when you are not up moving around and exerting yourself.  The breathing machine keeps your lungs expanded and your temperature down. ° ° °DIET:  As you were doing prior to hospitalization, we recommend a well-balanced diet. ° °DRESSING / WOUND CARE / SHOWERING ° °Keep the surgical dressing until follow up.  The dressing is water proof, so you can shower without any extra covering.  IF THE DRESSING FALLS OFF or the wound gets wet inside, change the dressing with sterile gauze.  Please use good hand washing techniques before changing the dressing.  Do not use any lotions or creams on the incision until instructed by your surgeon.   ° °ACTIVITY ° °o Increase activity slowly as tolerated, but follow the weight bearing instructions below.   °o No driving for 6 weeks or until further direction given by your physician.  You cannot drive while taking narcotics.  °o No lifting or carrying greater than 10 lbs. until further directed by your surgeon. °o Avoid periods of inactivity such as sitting longer than an hour when not asleep. This helps prevent blood clots.  °o You may return to work once you are authorized by your doctor.  ° ° ° °WEIGHT BEARING  ° °Weight bearing as tolerated with assist  device (walker, cane, etc) as directed, use it as long as suggested by your surgeon or therapist, typically at least 4-6 weeks. ° ° °EXERCISES ° °Results after joint replacement surgery are often greatly improved when you follow the exercise, range of motion and muscle strengthening exercises prescribed by your doctor. Safety measures are also important to protect the joint from further injury. Any time any of these exercises cause you to have increased pain or swelling, decrease what you are doing until you are comfortable again and then slowly increase them. If you have problems or questions, call your caregiver or physical therapist for advice.  ° °Rehabilitation is important following a joint replacement. After just a few days of immobilization, the muscles of the leg can become weakened and shrink (atrophy).  These exercises are designed to build up the tone and strength of the thigh and leg muscles and to improve motion. Often times heat used for twenty to thirty minutes before working out will loosen up your tissues and help with improving the range of motion but do not use heat for the first two weeks following surgery (sometimes heat can increase post-operative swelling).  ° °These exercises can be done on a training (exercise) mat, on the floor, on a table or on a bed. Use whatever works the best and is most comfortable for you.    Use music or television while you are exercising so that   the exercises are a pleasant break in your day. This will make your life better with the exercises acting as a break in your routine that you can look forward to.   Perform all exercises about fifteen times, three times per day or as directed.  You should exercise both the operative leg and the other leg as well. ° °Exercises include: °  °• Quad Sets - Tighten up the muscle on the front of the thigh (Quad) and hold for 5-10 seconds.   °• Straight Leg Raises - With your knee straight (if you were given a brace, keep it on),  lift the leg to 60 degrees, hold for 3 seconds, and slowly lower the leg.  Perform this exercise against resistance later as your leg gets stronger.  °• Leg Slides: Lying on your back, slowly slide your foot toward your buttocks, bending your knee up off the floor (only go as far as is comfortable). Then slowly slide your foot back down until your leg is flat on the floor again.  °• Angel Wings: Lying on your back spread your legs to the side as far apart as you can without causing discomfort.  °• Hamstring Strength:  Lying on your back, push your heel against the floor with your leg straight by tightening up the muscles of your buttocks.  Repeat, but this time bend your knee to a comfortable angle, and push your heel against the floor.  You may put a pillow under the heel to make it more comfortable if necessary.  ° °A rehabilitation program following joint replacement surgery can speed recovery and prevent re-injury in the future due to weakened muscles. Contact your doctor or a physical therapist for more information on knee rehabilitation.  ° ° °CONSTIPATION ° °Constipation is defined medically as fewer than three stools per week and severe constipation as less than one stool per week.  Even if you have a regular bowel pattern at home, your normal regimen is likely to be disrupted due to multiple reasons following surgery.  Combination of anesthesia, postoperative narcotics, change in appetite and fluid intake all can affect your bowels.  ° °YOU MUST use at least one of the following options; they are listed in order of increasing strength to get the job done.  They are all available over the counter, and you may need to use some, POSSIBLY even all of these options:   ° °Drink plenty of fluids (prune juice may be helpful) and high fiber foods °Colace 100 mg by mouth twice a day  °Senokot for constipation as directed and as needed Dulcolax (bisacodyl), take with full glass of water  °Miralax (polyethylene glycol)  once or twice a day as needed. ° °If you have tried all these things and are unable to have a bowel movement in the first 3-4 days after surgery call either your surgeon or your primary doctor.   ° °If you experience loose stools or diarrhea, hold the medications until you stool forms back up.  If your symptoms do not get better within 1 week or if they get worse, check with your doctor.  If you experience "the worst abdominal pain ever" or develop nausea or vomiting, please contact the office immediately for further recommendations for treatment. ° ° °ITCHING:  If you experience itching with your medications, try taking only a single pain pill, or even half a pain pill at a time.  You can also use Benadryl over the counter for itching or also to   help with sleep.   TED HOSE STOCKINGS:  Use stockings on both legs until for at least 2 weeks or as directed by physician office. They may be removed at night for sleeping.  MEDICATIONS:  See your medication summary on the After Visit Summary that nursing will review with you.  You may have some home medications which will be placed on hold until you complete the course of blood thinner medication.  It is important for you to complete the blood thinner medication as prescribed.  PRECAUTIONS:  If you experience chest pain or shortness of breath - call 911 immediately for transfer to the hospital emergency department.   If you develop a fever greater that 101 F, purulent drainage from wound, increased redness or drainage from wound, foul odor from the wound/dressing, or calf pain - CONTACT YOUR SURGEON.                                                   FOLLOW-UP APPOINTMENTS:  If you do not already have a post-op appointment, please call the office for an appointment to be seen by your surgeon.  Guidelines for how soon to be seen are listed in your After Visit Summary, but are typically between 1-4 weeks after surgery.  OTHER INSTRUCTIONS:   Knee  Replacement:  Do not place pillow under knee, focus on keeping the knee straight while resting. CPM instructions: 0-90 degrees, 2 hours in the morning, 2 hours in the afternoon, and 2 hours in the evening. Place foam block, curve side up under heel at all times except when in CPM or when walking.  DO NOT modify, tear, cut, or change the foam block in any way.  MAKE SURE YOU:   Understand these instructions.   Get help right away if you are not doing well or get worse.    Thank you for letting us be a part of your medical care team.  It is a privilege we respect greatly.  We hope these instructions will help you stay on track for a fast and full recovery!   Information on my medicine - XARELTO (Rivaroxaban)  This medication education was reviewed with me or my healthcare representative as part of my discharge preparation.  The pharmacist that spoke with me during my hospital stay was:  Shanequa Whitenight A, RPH  Why was Xarelto prescribed for you? Xarelto was prescribed for you to reduce the risk of blood clots forming after orthopedic surgery. The medical term for these abnormal blood clots is venous thromboembolism (VTE).  What do you need to know about xarelto ? Take your Xarelto ONCE DAILY at the same time every day. You may take it either with or without food.  If you have difficulty swallowing the tablet whole, you may crush it and mix in applesauce just prior to taking your dose.  Take Xarelto exactly as prescribed by your doctor and DO NOT stop taking Xarelto without talking to the doctor who prescribed the medication.  Stopping without other VTE prevention medication to take the place of Xarelto may increase your risk of developing a clot.  After discharge, you should have regular check-up appointments with your healthcare provider that is prescribing your Xarelto.    What do you do if you miss a dose? If you miss a dose, take it as soon as you  remember on the same day then  continue your regularly scheduled once daily regimen the next day. Do not take two doses of Xarelto on the same day.   Important Safety Information A possible side effect of Xarelto is bleeding. You should call your healthcare provider right away if you experience any of the following: ? Bleeding from an injury or your nose that does not stop. ? Unusual colored urine (red or dark brown) or unusual colored stools (red or black). ? Unusual bruising for unknown reasons. ? A serious fall or if you hit your head (even if there is no bleeding).  Some medicines may interact with Xarelto and might increase your risk of bleeding while on Xarelto. To help avoid this, consult your healthcare provider or pharmacist prior to using any new prescription or non-prescription medications, including herbals, vitamins, non-steroidal anti-inflammatory drugs (NSAIDs) and supplements.  This website has more information on Xarelto: [email or web address]

## 2015-12-06 NOTE — Evaluation (Signed)
Occupational Therapy Evaluation AND Discharge  Patient Details Name: [name] [name] MRN: [id number] DOB: 12/13/1960 Today's Date: 12/06/2015    History of Present Illness 55 yo male s/p RIGHT TOTAL HIP ARTHROPLASTY ANTERIOR APPROACH. PMH: HTN, GERD, arthritis    Clinical Impression   Patient admitted with above. Patient independent PTA. Patient currently functioning at an overall supervision level, just requiring increased assistance with LB ADLs - AE will increase independence with this and pt reports he has it at home and knows how to use it.  No additional OT needs identified, D/C from acute OT services and no additional follow-up OT needs at this time. All appropriate education provided to patient. Please re-order OT if needed.      Follow Up Recommendations  No OT follow up;Supervision - Intermittent    Equipment Recommendations   (Pt refusing 3-n-1)    Recommendations for Other Services  None at this time    Precautions / Restrictions Precautions Precautions: Fall Precaution Comments: direct anterior approach, no precautions  Restrictions Weight Bearing Restrictions: Yes RLE Weight Bearing: Weight bearing as tolerated     Mobility Bed Mobility General bed mobility comments: Pt found seated in recliner upon OT entering room   Transfers Overall transfer level: Needs assistance Equipment used: Rolling walker (2 wheeled) Transfers: Sit to/from Stand Sit to Stand: Supervision         General transfer comment: Supervision for safety     Balance Overall balance assessment: No apparent balance deficits (not formally assessed)    ADL Overall ADL's : Needs assistance/impaired Eating/Feeding: Set up;Sitting   Grooming: Supervision/safety;Standing   Upper Body Bathing: Set up;Sitting   Lower Body Bathing: Sit to/from stand;Minimal assistance Lower Body Bathing Details (indicate cue type and reason): Encouraged pt to purchase LH sponge to increase independence with  LB bathing  Upper Body Dressing : Set up;Sitting   Lower Body Dressing: Moderate assistance;Sit to/from stand Lower Body Dressing Details (indicate cue type and reason): Pt states he has a sock aid at home and knows how to use it, encouraged him to use this to increase independence and safety with LB dressing  Toilet Transfer: Supervision/safety;RW;Ambulation;Comfort height toilet   Toileting- Clothing Manipulation and Hygiene: Supervision/safety;Sit to/from stand   Tub/ Shower Transfer: Supervision/safety;3 in 1;Ambulation;Anterior/posterior;Rolling walker   Functional mobility during ADLs: Supervision/safety;Cueing for safety;Rolling walker General ADL Comments: Pt overall supervision, but requires up to mod assist for LB ADLs - using AE will increase patient's independence with this.    Vision Vision Assessment?: No apparent visual deficits          Pertinent Vitals/Pain Pain Assessment: Faces Faces Pain Scale: Hurts a little bit Pain Location: RLE/hip Pain Descriptors / Indicators: Aching;Sore;Discomfort Pain Intervention(s): Monitored during session;Repositioned;Ice applied     Hand Dominance Right   Extremity/Trunk Assessment Upper Extremity Assessment Upper Extremity Assessment: Overall WFL for tasks assessed   Lower Extremity Assessment Lower Extremity Assessment: Defer to PT evaluation   Cervical / Trunk Assessment Cervical / Trunk Assessment: Normal   Communication Communication Communication: No difficulties   Cognition Arousal/Alertness: Awake/alert Behavior During Therapy: WFL for tasks assessed/performed Overall Cognitive Status: Within Functional Limits for tasks assessed              Home Living Family/patient expects to be discharged to:: Private residence Living Arrangements: Alone Available Help at Discharge: Family;Available PRN/intermittently Type of Home: House             Bathroom Shower/Tub: Tub/shower unit;Walk-in shower    Bathroom Toilet: Standard  Home Equipment: None   Prior Functioning/Environment Level of Independence: Independent      OT Diagnosis: Generalized weakness;Acute pain   OT Problem List:   N/a, no acute OT needs identified at this time     OT Treatment/Interventions:   N/a, no acute OT needs identified at this time     OT Goals(Current goals can be found in the care plan section) Acute Rehab OT Goals Patient Stated Goal: go home today! OT Goal Formulation: All assessment and education complete, DC therapy  OT Frequency:   N/a, no acute OT needs identified at this time     Barriers to D/C:  None known at this time    End of Session Equipment Utilized During Treatment: Rolling walker Nurse Communication: Mobility status  Activity Tolerance: Patient tolerated treatment well Patient left: in chair;with call bell/phone within reach   Time: 1151-1208 OT Time Calculation (min): 17 min Charges:  OT General Charges $OT Visit: 1 Procedure OT Evaluation $OT Eval Low Complexity: 1 Procedure  Edwin CapPatricia Stancil Deisher , MS, OTR/L, CLT  12/06/2015, 12:17 PM

## 2015-12-06 NOTE — Evaluation (Signed)
Physical Therapy Evaluation Patient Details Name: [name] [name] MRN: [id number] DOB: 1960/07/25 Today's Date: 12/06/2015   History of Present Illness  55 yo male s/p RIGHT TOTAL HIP ARTHROPLASTY ANTERIOR APPROACH. PMH: HTN, GERD, arthritis   Clinical Impression  Pt s/p R THR presents with decreased R LE strength/ROM and post op pain limiting functional mobility.  Pt should progress to dc home with family assist and HHPT follow up.    Follow Up Recommendations Home health PT    Equipment Recommendations  None recommended by PT    Recommendations for Other Services OT consult     Precautions / Restrictions Precautions Precautions: Fall Precaution Comments: direct anterior approach, no precautions  Restrictions Weight Bearing Restrictions: No RLE Weight Bearing: Weight bearing as tolerated      Mobility  Bed Mobility Overal bed mobility: Needs Assistance Bed Mobility: Supine to Sit     Supine to sit: Min assist     General bed mobility comments: cues for sequence and use of L LE to self assist  Transfers Overall transfer level: Needs assistance Equipment used: Rolling walker (2 wheeled) Transfers: Sit to/from Stand Sit to Stand: Min guard         General transfer comment: cues for LE management and use of UEs to self assist  Ambulation/Gait Ambulation/Gait assistance: Min assist;Min guard Ambulation Distance (Feet): 160 Feet Assistive device: Rolling walker (2 wheeled) Gait Pattern/deviations: Step-to pattern;Step-through pattern;Decreased step length - right;Decreased step length - left;Shuffle;Antalgic;Trunk flexed Gait velocity: decr Gait velocity interpretation: Below normal speed for age/gender General Gait Details: cues for posture, position from RW and initial sequenc  Stairs            Wheelchair Mobility    Modified Rankin (Stroke Patients Only)       Balance Overall balance assessment: No apparent balance deficits (not formally  assessed)                                           Pertinent Vitals/Pain Pain Assessment: 0-10 Pain Score: 4  Faces Pain Scale: Hurts a little bit Pain Location: R hip/thigh Pain Descriptors / Indicators: Aching;Sore Pain Intervention(s): Limited activity within patient's tolerance;Monitored during session;Premedicated before session;Ice applied    Home Living Family/patient expects to be discharged to:: Private residence Living Arrangements: Alone Available Help at Discharge: Family;Available PRN/intermittently Type of Home: House Home Access: Level entry     Home Layout: One level Home Equipment: Walker - 2 wheels;Cane - single point;Bedside commode Additional Comments: Pt will borrow equipment from brother who had THR ~ 1 month ago.  Pt will be staying at father's home    Prior Function Level of Independence: Independent               Hand Dominance   Dominant Hand: Right    Extremity/Trunk Assessment   Upper Extremity Assessment: Overall WFL for tasks assessed           Lower Extremity Assessment: RLE deficits/detail RLE Deficits / Details: Strength at hip 2+/5 with AAROM at hip to 80 flex and 20 abd    Cervical / Trunk Assessment: Normal  Communication   Communication: No difficulties  Cognition Arousal/Alertness: Awake/alert Behavior During Therapy: WFL for tasks assessed/performed Overall Cognitive Status: Within Functional Limits for tasks assessed  General Comments      Exercises Total Joint Exercises Ankle Circles/Pumps: AROM;Both;15 reps;Supine Quad Sets: AROM;Both;10 reps;Supine Heel Slides: AAROM;Right;20 reps;Supine Hip ABduction/ADduction: AAROM;Right;15 reps;Supine      Assessment/Plan    PT Assessment Patient needs continued PT services  PT Diagnosis Difficulty walking   PT Problem List Decreased strength;Decreased range of motion;Decreased activity tolerance;Decreased  mobility;Decreased knowledge of use of DME;Pain;Obesity  PT Treatment Interventions DME instruction;Gait training;Stair training;Functional mobility training;Therapeutic activities;Therapeutic exercise;Patient/family education   PT Goals (Current goals can be found in the Care Plan section) Acute Rehab PT Goals Patient Stated Goal: go home today! PT Goal Formulation: With patient Time For Goal Achievement: 12/07/15 Potential to Achieve Goals: Good    Frequency 7X/week   Barriers to discharge        Co-evaluation               End of Session Equipment Utilized During Treatment: Gait belt Activity Tolerance: Patient tolerated treatment well Patient left: in chair;with call bell/phone within reach Nurse Communication: Mobility status         Time: 4098-11911036-1110 PT Time Calculation (min) (ACUTE ONLY): 34 min   Charges:   PT Evaluation $PT Eval Low Complexity: 1 Procedure PT Treatments $Therapeutic Exercise: 8-22 mins   PT G Codes:        Sarye Kath 12/06/2015, 1:12 PM

## 2015-12-06 NOTE — Progress Notes (Signed)
Physical Therapy Treatment Patient Details Name: [name] [name] MRN: [id number] DOB: [birth year] Today's Date: 12/06/2015    History of Present Illness 55 yo male s/p RIGHT TOTAL HIP ARTHROPLASTY ANTERIOR APPROACH. PMH: HTN, GERD, arthritis     PT Comments    Pt progressing well and eager for return home.  Reviewed stairs and car transfers with written therex program provided.  Follow Up Recommendations  Home health PT     Equipment Recommendations  None recommended by PT    Recommendations for Other Services OT consult     Precautions / Restrictions Precautions Precautions: Fall Precaution Comments: direct anterior approach, no precautions  Restrictions Weight Bearing Restrictions: No RLE Weight Bearing: Weight bearing as tolerated    Mobility  Bed Mobility Overal bed mobility: Needs Assistance Bed Mobility: Supine to Sit     Supine to sit: Min assist     General bed mobility comments: NT  Transfers Overall transfer level: Needs assistance Equipment used: Rolling walker (2 wheeled) Transfers: Sit to/from Stand Sit to Stand: Supervision         General transfer comment: min cues for LE management and use of UEs to self assist  Ambulation/Gait Ambulation/Gait assistance: Min guard;Supervision Ambulation Distance (Feet): 70 Feet Assistive device: Rolling walker (2 wheeled) Gait Pattern/deviations: Step-to pattern;Step-through pattern;Decreased step length - right;Decreased step length - left;Shuffle;Trunk flexed Gait velocity: decr Gait velocity interpretation: Below normal speed for age/gender General Gait Details: min cues for posture, position from RW and initial sequenc   Stairs Stairs: Yes Stairs assistance: Min assist Stair Management: No rails;Two rails;Step to pattern;Forwards;With walker Number of Stairs: 7 General stair comments: single step fwd twice with RW and 5 steps with bil rails.  Cues for sequence and foot/RW placement  Wheelchair  Mobility    Modified Rankin (Stroke Patients Only)       Balance Overall balance assessment: No apparent balance deficits (not formally assessed)                                  Cognition Arousal/Alertness: Awake/alert Behavior During Therapy: WFL for tasks assessed/performed Overall Cognitive Status: Within Functional Limits for tasks assessed                      Exercises Total Joint Exercises Ankle Circles/Pumps: AROM;Both;15 reps;Supine Quad Sets: AROM;Both;10 reps;Supine Heel Slides: AAROM;Right;20 reps;Supine Hip ABduction/ADduction: AAROM;Right;15 reps;Supine    General Comments        Pertinent Vitals/Pain Pain Assessment: 0-10 Pain Score: 5  Faces Pain Scale: Hurts a little bit Pain Location: R hip/thigh Pain Descriptors / Indicators: Aching;Sore Pain Intervention(s): Monitored during session;Limited activity within patient's tolerance;Premedicated before session;Ice applied    Home Living Family/patient expects to be discharged to:: Private residence Living Arrangements: Alone Available Help at Discharge: Family;Available PRN/intermittently Type of Home: House Home Access: Level entry   Home Layout: One level Home Equipment: Walker - 2 wheels;Cane - single point;Bedside commode Additional Comments: Pt will borrow equipment from brother who had THR ~ 1 month ago.  Pt will be staying at father's home    Prior Function Level of Independence: Independent          PT Goals (current goals can now be found in the care plan section) Acute Rehab PT Goals Patient Stated Goal: go home today! PT Goal Formulation: With patient Time For Goal Achievement: 12/07/15 Potential to Achieve Goals: Good Progress towards PT goals: Progressing  toward goals    Frequency  7X/week    PT Plan Current plan remains appropriate    Co-evaluation             End of Session Equipment Utilized During Treatment: Gait belt Activity Tolerance:  Patient tolerated treatment well Patient left: in chair;with call bell/phone within reach     Time: 1120-1138 PT Time Calculation (min) (ACUTE ONLY): 18 min  Charges:  $Gait Training: 8-22 mins $Therapeutic Exercise: 8-22 mins                    G Codes:      Corran Lalone 12/06/2015, 1:19 PM

## 2015-12-06 NOTE — Discharge Summary (Signed)
Patient ID: Lowella DandyGregory Kercheval MRN: [id number] DOB/AGE: May 19, 1961 55 y.o.  Admit date: 12/05/2015 Discharge date: 12/06/2015  Admission Diagnoses:  Principal Problem:   Osteoarthritis of right hip Active Problems:   Status post total replacement of right hip   Discharge Diagnoses:  Same  Past Medical History  Diagnosis Date  . Hypertension   . DVT (deep venous thrombosis) (HCC)     right thigh s/p knee scope surgery  . GERD (gastroesophageal reflux disease)     Protonix controls  . Headache     migraines periodically- once to twice monthly to none in a period of time.  . Arthritis     osteoarthritis-hips, knees  . Gastric ulcer     history of many yrs ago.    Surgeries: Procedure(s): RIGHT TOTAL HIP ARTHROPLASTY ANTERIOR APPROACH on 12/05/2015   Consultants:    Discharged Condition: Improved  Hospital Course: Lowella DandyGregory Boran is an 55 y.o. male who was admitted 12/05/2015 for operative treatment ofOsteoarthritis of right hip. Patient has severe unremitting pain that affects sleep, daily activities, and work/hobbies. After pre-op clearance the patient was taken to the operating room on 12/05/2015 and underwent  Procedure(s): RIGHT TOTAL HIP ARTHROPLASTY ANTERIOR APPROACH.    Patient was given perioperative antibiotics: Anti-infectives    Start     Dose/Rate Route Frequency Ordered Stop   12/05/15 1800  ceFAZolin (ANCEF) IVPB 1 g/50 mL premix     1 g 100 mL/hr over 30 Minutes Intravenous Every 6 hours 12/05/15 1537 12/06/15 0047   12/05/15 1011  ceFAZolin (ANCEF) IVPB 2g/100 mL premix     2 g 200 mL/hr over 30 Minutes Intravenous On call to O.R. 12/05/15 1011 12/05/15 1158       Patient was given sequential compression devices, early ambulation, and chemoprophylaxis to prevent DVT.  Patient benefited maximally from hospital stay and there were no complications.    Recent vital signs: Patient Vitals for the past 24 hrs:  BP Temp Temp src Pulse Resp SpO2 Height Weight   12/06/15 0445 110/75 mmHg 98.1 F (36.7 C) Oral 77 18 100 % - -  12/06/15 0154 97/61 mmHg 98.1 F (36.7 C) Oral 72 18 100 % - -  12/05/15 2125 123/79 mmHg 97.9 F (36.6 C) Oral 93 18 100 % - -  12/05/15 1835 119/82 mmHg 98.7 F (37.1 C) Oral 87 16 98 % - -  12/05/15 1730 122/79 mmHg 97.7 F (36.5 C) Oral 84 18 100 % - -  12/05/15 1634 117/85 mmHg 97.7 F (36.5 C) Oral 84 16 100 % - -  12/05/15 1533 118/75 mmHg 97.7 F (36.5 C) Oral 79 15 98 % 5\' 5"  (1.651 m) 97.977 kg (216 lb)  12/05/15 1515 109/78 mmHg - - 79 17 98 % - -  12/05/15 1500 106/71 mmHg 98 F (36.7 C) - 77 15 98 % - -  12/05/15 1445 108/73 mmHg - - 73 12 98 % - -  12/05/15 1430 105/71 mmHg - - 84 15 97 % - -  12/05/15 1415 (!) 88/60 mmHg 98 F (36.7 C) - 79 13 100 % - -  12/05/15 1400 107/77 mmHg - - 82 12 100 % - -  12/05/15 1345 96/70 mmHg 98 F (36.7 C) - 88 14 98 % - -  12/05/15 1015 (!) 133/95 mmHg 98.2 F (36.8 C) Oral 74 18 99 % - -     Recent laboratory studies:  Recent Labs  12/06/15 0410  WBC 14.9*  HGB 13.2  HCT 38.5*  PLT 189  NA 140  K 4.6  CL 111  CO2 25  BUN 13  CREATININE 1.46*  GLUCOSE 141*  CALCIUM 8.5*     Discharge Medications:     Medication List    TAKE these medications        acetaminophen 500 MG tablet  Commonly known as:  TYLENOL  Take 1,000 mg by mouth every 6 (six) hours as needed (For pain.).     cetirizine 10 MG tablet  Commonly known as:  ZYRTEC  Take 10 mg by mouth daily.     cyclobenzaprine 10 MG tablet  Commonly known as:  FLEXERIL  Take 1 tablet (10 mg total) by mouth 3 (three) times daily as needed for muscle spasms.     dextromethorphan-guaiFENesin 30-600 MG 12hr tablet  Commonly known as:  MUCINEX DM  Take 1 tablet by mouth 2 (two) times daily as needed for cough.     losartan 25 MG tablet  Commonly known as:  COZAAR  Take 25 mg by mouth daily.     oxyCODONE-acetaminophen 5-325 MG tablet  Commonly known as:  ROXICET  Take 1-2 tablets by  mouth every 4 (four) hours as needed.     pantoprazole 40 MG tablet  Commonly known as:  PROTONIX  Take 40 mg by mouth daily.     pravastatin 40 MG tablet  Commonly known as:  PRAVACHOL  Take 40 mg by mouth daily.     PROBIOTIC PO  Take 1 capsule by mouth daily.     rivaroxaban 10 MG Tabs tablet  Commonly known as:  XARELTO  Take 1 tablet (10 mg total) by mouth daily with breakfast.     SUMAtriptan 100 MG tablet  Commonly known as:  IMITREX  Take 50 mg by mouth every 2 (two) hours as needed for migraine or headache. May repeat in 2 hours if headache persists or recurs.     Vitamin D (Ergocalciferol) 50000 units Caps capsule  Commonly known as:  DRISDOL  Take 50,000 Units by mouth every Monday.        Diagnostic Studies: Dg C-arm 61-120 Min-no Report  12/05/2015  CLINICAL DATA: surgery C-ARM 61-120 MINUTES Fluoroscopy was utilized by the requesting physician.  No radiographic interpretation.   Dg Hip Port Unilat With Pelvis 1v Right  12/05/2015  CLINICAL DATA:  Total right hip replacement. EXAM: DG HIP (WITH OR WITHOUT PELVIS) 1V PORT RIGHT COMPARISON:  12/05/2015.  10/27/2015. FINDINGS: Changes of right hip replacement. Normal alignment. No hardware or bony complicating features. Moderate degenerative changes within the left hip. IMPRESSION: Right hip replacement.  No complicating feature. Electronically Signed   By: Charlett Nose M.D.   On: 12/05/2015 14:34   Dg Hip Operative Unilat W Or W/o Pelvis Right  12/05/2015  CLINICAL DATA:  55 year old male undergoing right side hip replacement. Initial encounter. EXAM: OPERATIVE RIGHT HIP (WITH PELVIS IF PERFORMED) TWO VIEWS TECHNIQUE: Fluoroscopic spot image(s) were submitted for interpretation post-operatively. COMPARISON:  Outside right hip series 10/27/2015 FINDINGS: Two intraoperative fluoroscopic images demonstrate right hip arthroplasty hardware which appears intact, and normally aligned in the AP projection. No other acute osseous  abnormality about the visible pelvis. IMPRESSION: Right hip arthroplasty with no adverse features. Electronically Signed   By: Odessa Fleming M.D.   On: 12/05/2015 13:20    Disposition:  To home      Discharge Instructions    Call MD / Call 911    Complete by:  As directed   If you experience chest pain or shortness of breath, CALL 911 and be transported to the hospital emergency room.  If you develope a fever above 101 F, pus (white drainage) or increased drainage or redness at the wound, or calf pain, call your surgeon's office.     Constipation Prevention    Complete by:  As directed   Drink plenty of fluids.  Prune juice may be helpful.  You may use a stool softener, such as Colace (over the counter) 100 mg twice a day.  Use MiraLax (over the counter) for constipation as needed.     Diet - low sodium heart healthy    Complete by:  As directed      Discharge patient    Complete by:  As directed      Increase activity slowly as tolerated    Complete by:  As directed            Follow-up Information    Follow up with Kathryne HitchBLACKMAN,CHRISTOPHER Y, MD In 2 weeks.   Specialty:  Orthopedic Surgery   Contact information:   [address] [place] [address] [phone]        Signed: Kathryne HitchBLACKMAN,CHRISTOPHER Y 12/06/2015, 7:28 AM

## 2015-12-06 NOTE — Care Management Note (Addendum)
Case Management Note  Patient Details  Name: [name] [name] MRN: [id number] Date of Birth: [birth year]  Subjective/Objective:    Status post total replacement of right hip                Action/Plan: Discharge Planning: AVS reviewed:  NCM spoke to pt and explained HH will be arranged by his insurance [employer] through [facility] # [id number]. Faxed orders and dc summary to [facility]. Case ID [id number]. Pt reports he has RW at home.  12/08/2015 1039 Received call from [facility]. States they did not receive fax. Explained successful transmission was received on 12/06/2015. Will refax orders and dc summary to [facility]. States they will have HH PT start today.    Expected Discharge Date:  12/06/2015               Expected Discharge Plan:  Home w Home Health Services  In-House Referral:  NA  Discharge planning Services  CM Consult  Post Acute Care Choice:  Home Health Choice offered to:  Patient  DME Arranged:  N/A DME Agency:  NA  HH Arranged:  PT HH Agency:  Other - See comment  Status of Service:  Completed, signed off  If discussed at Long Length of Stay Meetings, dates discussed:    Additional Comments:  [name], [name], RN 12/06/2015, 3:23 PM

## 2015-12-22 NOTE — Care Management (Signed)
12/08/2015 Message from [facility] and [facility] scheduled to do HHPT. Isidoro DonningAlesia Yamir Carignan RN CCM Case Mgmt phone [phone]

## 2016-06-17 ENCOUNTER — Ambulatory Visit (INDEPENDENT_AMBULATORY_CARE_PROVIDER_SITE_OTHER): Payer: Self-pay | Admitting: Orthopaedic Surgery

## 2016-06-21 ENCOUNTER — Ambulatory Visit (INDEPENDENT_AMBULATORY_CARE_PROVIDER_SITE_OTHER): Payer: Self-pay | Admitting: Physician Assistant

## 2016-07-07 ENCOUNTER — Ambulatory Visit (INDEPENDENT_AMBULATORY_CARE_PROVIDER_SITE_OTHER): Payer: Self-pay | Admitting: Orthopaedic Surgery

## 2016-08-24 ENCOUNTER — Ambulatory Visit (INDEPENDENT_AMBULATORY_CARE_PROVIDER_SITE_OTHER): Payer: Managed Care, Other (non HMO) | Admitting: Physician Assistant

## 2016-08-24 ENCOUNTER — Encounter (INDEPENDENT_AMBULATORY_CARE_PROVIDER_SITE_OTHER): Payer: Self-pay

## 2016-08-24 ENCOUNTER — Ambulatory Visit (INDEPENDENT_AMBULATORY_CARE_PROVIDER_SITE_OTHER): Payer: Self-pay

## 2016-08-24 DIAGNOSIS — M1711 Unilateral primary osteoarthritis, right knee: Secondary | ICD-10-CM | POA: Diagnosis not present

## 2016-08-24 DIAGNOSIS — Z96641 Presence of right artificial hip joint: Secondary | ICD-10-CM

## 2016-08-24 NOTE — Progress Notes (Signed)
Office Visit Note   Patient: [name] [name]           Date of Birth: [birth year]           MRN: [id number] Visit Date: 08/24/2016              Requested by: No referring provider defined for this encounter. PCP: [name] DillonLee A Johnson, MD, MD   Assessment & Plan: Visit Diagnoses:  1. History of right hip replacement     Plan:He will follow up with us on an as-needed basis. He develops any pain or redness or has any concerns whatsoever in regards to the right hip he will return. I did talk to him about his multiple blood clots had since surgery and suggested he talk with his primary care physician about seeing a heme oncologist for possible workup.  Follow-Up Instructions: Return if symptoms worsen or fail to improve.   Orders:  Orders Placed This Encounter  Procedures  . XR HIP UNILAT W OR W/O PELVIS 1V RIGHT   No orders of the defined types were placed in this encounter.     Procedures: No procedures performed   Clinical Data: No additional findings.   Subjective: No chief complaint on file.   HPI Mr. [name] ShelterWatkins returns today 9 months status post right total hip arthroplasty. He states overall that the use of a giving there still has somewhat decreased range of motion compared to his nonoperative hip. No pain in the right hip. He has had 2 additional blood clot since surgery he's been treated Xarelto  for this.  Review of Systems No fevers chill, SOB, nausea or vomiting.   Objective: Vital Signs: There were no vitals taken for this visit.  Physical Exam  Constitutional: He appears well-developed and well-nourished. No distress.    Ortho Exam Right hip good range of motion without pain. He ambulates without any assistive device and a nonantalgic gait.  Specialty Comments:  No specialty comments available.  Imaging: Xr Hip Unilat W Or W/o Pelvis 1v Right  Result Date: 08/24/2016 AP pelvis and right lateral hip: Well-seated right total hip arthroplasty without any  evidence of loosening or hardware failure. No acute fracture bilateral hips well located. Left hip with moderate arthritic changes.    PMFS History: Patient Active Problem List   Diagnosis Date Noted  . Osteoarthritis of right hip 12/05/2015  . 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 Arthritis    osteoarthritis-hips, knees  . DVT (deep venous thrombosis) (HCC)    right thigh s/p knee scope surgery  . Gastric ulcer    history of many yrs ago.  Marland Kitchen. GERD (gastroesophageal reflux disease)    Protonix controls  . Headache    migraines periodically- once to twice monthly to none in a period of time.  . Hypertension     No family history on file.  Past Surgical History:  Procedure Laterality Date  . KNEE ARTHROSCOPY Right    knee scope surgery  . TOTAL HIP ARTHROPLASTY Right 12/05/2015   Procedure: RIGHT TOTAL HIP ARTHROPLASTY ANTERIOR APPROACH;  Surgeon: [name] Hitchhristopher Y Blackman, MD;  Location: [facility];  Service: Orthopedics;  Laterality: Right;   Social History   Occupational History  . Not on file.   Social History Main Topics  . Smoking status: Never Smoker  . Smokeless tobacco: Never Used  . Alcohol use No  . Drug use: No  . Sexual activity: Not  on file

## 2018-08-17 ENCOUNTER — Ambulatory Visit (INDEPENDENT_AMBULATORY_CARE_PROVIDER_SITE_OTHER): Payer: Managed Care, Other (non HMO) | Admitting: Orthopaedic Surgery

## 2018-08-17 ENCOUNTER — Ambulatory Visit (INDEPENDENT_AMBULATORY_CARE_PROVIDER_SITE_OTHER): Payer: Managed Care, Other (non HMO)

## 2018-08-17 VITALS — Ht 66.0 in | Wt 215.0 lb

## 2018-08-17 DIAGNOSIS — M25552 Pain in left hip: Secondary | ICD-10-CM

## 2018-08-17 NOTE — Progress Notes (Signed)
Office Visit Note   Patient: [name] [name]           Date of Birth: 05/29/1961           MRN: [id number] Visit Date: 08/17/2018              Requested by: Ricard Dillon, MD [address] [place], [place] [address] PCP: Ricard Dillon, MD   Assessment & Plan: Visit Diagnoses:  1. Pain in left hip     Plan: At this point I agree that he is ready for hip replacement on the left side.  Having had this before he is fully aware of what the surgery involves the direct anterior approach.  We talked about his intraoperative and postoperative course.  We discussed the risk and benefits of surgery in detail.  All question concerns were answered and addressed.  We will work on getting this scheduled.  Follow-Up Instructions: Return for 2 weeks post-op.   Orders:  Orders Placed This Encounter  Procedures  . XR HIP UNILAT W OR W/O PELVIS 1V LEFT   No orders of the defined types were placed in this encounter.     Procedures: No procedures performed   Clinical Data: No additional findings.   Subjective: Chief Complaint  Patient presents with  . Left Hip - Pain  Patient is very well-known to me.  He is a 58 year old gentleman who is 3 years status post a right total hip arthroplasty.  Even 2 years ago his hip on the left side showed severe end-stage arthritis however clinically he was doing well.  Now is gotten to where he is noticing the pain daily in his left hip.  At this point he can be 10 out of 10 at times.  He has a struggle getting his shoes and socks on on the left side.  His left hip pain is detrimentally factor his mobility and his quality of life.  He said his right hip is doing well and is pain-free and is mobile and not stiff.  At this point he feels that he is ready for hip replacement on his left hip given the worsening of the symptoms and his known arthritis is been going on for at least 3 to 4 years in the left hip as well.  He is failed conservative  treatment for well over a year now.  He is not a diabetic and not a smoker.  His BMI is 34.7.  HPI  Review of Systems He currently denies any headache, chest pain, shortness of breath, fever, chills, nausea, vomiting  Objective: Vital Signs: Ht 5\' 6"  (1.676 m)   Wt 215 lb (97.5 kg)   BMI 34.70 kg/m   Physical Exam He is alert and orient x3 and in no acute distress Ortho Exam Examination of his right hip shows that he moves fluidly and smoothly.  Examination of his left hip shows significant stiffness and pain with attempts of internal and external rotation. Specialty Comments:  No specialty comments available.  Imaging: Xr Hip Unilat W Or W/o Pelvis 1v Left  Result Date: 08/17/2018 An AP pelvis and lateral of the left hip show severe end-stage arthritis of the left hip.  This is worsened from previous films.  There is complete loss of the superior lateral joint space.  There are large particular osteophytes and sclerotic changes in the femoral head and acetabulum.  There is a right total hip arthroplasties appear stable.    PMFS History: Patient Active  Problem List   Diagnosis Date Noted  . Osteoarthritis of right hip 12/05/2015  . 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 Arthritis    osteoarthritis-hips, knees  . DVT (deep venous thrombosis) (HCC)    right thigh s/p knee scope surgery  . Gastric ulcer    history of many yrs ago.  Marland Kitchen GERD (gastroesophageal reflux disease)    Protonix controls  . Headache    migraines periodically- once to twice monthly to none in a period of time.  . Hypertension     No family history on file.  Past Surgical History:  Procedure Laterality Date  . KNEE ARTHROSCOPY Right    knee scope surgery  . TOTAL HIP ARTHROPLASTY Right 12/05/2015   Procedure: RIGHT TOTAL HIP ARTHROPLASTY ANTERIOR APPROACH;  Surgeon: Kathryne Hitch, MD;  Location: [facility];  Service: Orthopedics;   Laterality: Right;   Social History   Occupational History  . Not on file  Tobacco Use  . Smoking status: Never Smoker  . Smokeless tobacco: Never Used  Substance and Sexual Activity  . Alcohol use: No  . Drug use: No  . Sexual activity: Not on file

## 2018-10-02 ENCOUNTER — Inpatient Hospital Stay (INDEPENDENT_AMBULATORY_CARE_PROVIDER_SITE_OTHER): Payer: Managed Care, Other (non HMO) | Admitting: Orthopaedic Surgery

## 2018-10-18 ENCOUNTER — Other Ambulatory Visit: Payer: Self-pay | Admitting: Physician Assistant

## 2018-10-19 ENCOUNTER — Other Ambulatory Visit: Payer: Self-pay

## 2018-10-24 ENCOUNTER — Telehealth: Payer: Self-pay | Admitting: Orthopaedic Surgery

## 2018-10-24 NOTE — Telephone Encounter (Signed)
Received voicemail from [place] from [place] stating patient need to be set up for [facility]. Chelsea asked that the order be faxed to [facility] Fax# [phone]   Attn: Intake   Patient is requesting Roseanna Rainbow with [facility]. Chelsea did not leave a phone number to contact her.

## 2018-10-25 ENCOUNTER — Telehealth: Payer: Self-pay | Admitting: Orthopaedic Surgery

## 2018-10-25 NOTE — Telephone Encounter (Signed)
I called Diannia Ruder and left voicemail advising.

## 2018-10-25 NOTE — Telephone Encounter (Signed)
Faxed to provided number  

## 2018-10-25 NOTE — Telephone Encounter (Signed)
I called Diannia Ruder and advised if patient's insurance is not accepted for this group for physical therapy, then she could use another one. She is trying to find one in [place] who will accept his insurance. She also asked if patient would need home health nursing. I told her not at this time.    Please call Diannia Ruder back if Dr. Magnus Ivan routinely orders [facility] post operatively.

## 2018-10-25 NOTE — Telephone Encounter (Signed)
[name] [name] with [facility] called asked if Dr Magnus Ivan will be following the patient's care after surgery? The number to contact [name] [name] is [phone] EXT: [phone]

## 2018-10-25 NOTE — Patient Instructions (Addendum)
[name] [name]  10/25/2018   YOU ARE REQUIRED TO BE TESTED FOR COVID-19 PRIOR TO YOUR SURGERY . YOUR TEST MUST BE COMPLETED ON Tuesday 10-31-2018. TESTING IS LOCATED AT THE [facility] ENTRANCE FROM 9:00AM - 3:00PM. FAILURE TO COMPLETE TESTING MAY RESULT IN CANCELLATION OF YOUR SURGERY.    __________________________________________________________________________________________________     Your procedure is scheduled on: 11-03-2018     Report to [facility] Main  Entrance     Report to SHORT STAY at 5:45AM     Call this number if you have problems the morning of surgery [phone]     Remember: Do not eat food After Midnight. YOU MAY HAVE CLEAR LIQUIDS FROM MIDNIGHT UNTIL 4:30AM. At 4:30AM Please finish the prescribed Pre-Surgery ENSURE drink. Nothing by mouth after you finish the ENSURE drink !   BRUSH YOUR TEETH MORNING OF SURGERY AND RINSE YOUR MOUTH OUT, NO CHEWING GUM CANDY OR MINTS.    CLEAR LIQUID DIET   Foods Allowed                                                                     Foods Excluded  Coffee and tea, regular and decaf                             liquids that you cannot  Plain Jell-O in any flavor                                             see through such as: Fruit ices (not with fruit pulp)                                     milk, soups, orange juice  Iced Popsicles                                    All solid food Carbonated beverages, regular and diet                                    Cranberry, grape and apple juices Sports drinks like Gatorade Lightly seasoned clear broth or consume(fat free) Sugar, honey syrup  Sample Menu Breakfast                                Lunch                                     Supper Cranberry juice                    Beef broth  Chicken broth Jell-O                                     Grape juice                           Apple  juice Coffee or tea                        Jell-O                                      Popsicle                                                Coffee or tea                        Coffee or tea  _____________________________________________________________________       Take these medicines the morning of surgery with A SIP OF WATER: cetirizine, omeprazole, sumatriptan if needed, tylenol if needed                                  You may not have any metal on your body including hair pins and              piercings  Do not wear jewelry, make-up, lotions, powders or perfumes, deodorant                         Men may shave face and neck.   Do not bring valuables to the hospital. [facility] IS NOT             RESPONSIBLE   FOR VALUABLES.  Contacts, dentures or bridgework may not be worn into surgery.                Please read over the following fact sheets you were given: _____________________________________________________________________             [facility] - Preparing for Surgery Before surgery, you can play an important role.  Because skin is not sterile, your skin needs to be as free of germs as possible.  You can reduce the number of germs on your skin by washing with CHG (chlorahexidine gluconate) soap before surgery.  CHG is an antiseptic cleaner which kills germs and bonds with the skin to continue killing germs even after washing. Please DO NOT use if you have an allergy to CHG or antibacterial soaps.  If your skin becomes reddened/irritated stop using the CHG and inform your nurse when you arrive at Short Stay. Do not shave (including legs and underarms) for at least 48 hours prior to the first CHG shower.  You may shave your face/neck. Please follow these instructions carefully:  1.  Shower with CHG Soap the night before surgery and the  morning of Surgery.  2.  If you choose to wash your hair, wash your hair first as usual with your  normal  shampoo.  3.  After  you shampoo,  rinse your hair and body thoroughly to remove the  shampoo.                           4.  Use CHG as you would any other liquid soap.  You can apply chg directly  to the skin and wash                       Gently with a scrungie or clean washcloth.  5.  Apply the CHG Soap to your body ONLY FROM THE NECK DOWN.   Do not use on face/ open                           Wound or open sores. Avoid contact with eyes, ears mouth and genitals (private parts).                       Wash face,  Genitals (private parts) with your normal soap.             6.  Wash thoroughly, paying special attention to the area where your surgery  will be performed.  7.  Thoroughly rinse your body with warm water from the neck down.  8.  DO NOT shower/wash with your normal soap after using and rinsing off  the CHG Soap.                9.  Pat yourself dry with a clean towel.            10.  Wear clean pajamas.            11.  Place clean sheets on your bed the night of your first shower and do not  sleep with pets. Day of Surgery : Do not apply any lotions/deodorants the morning of surgery.  Please wear clean clothes to the hospital/surgery center.  FAILURE TO FOLLOW THESE INSTRUCTIONS MAY RESULT IN THE CANCELLATION OF YOUR SURGERY PATIENT SIGNATURE_________________________________  NURSE SIGNATURE__________________________________  ________________________________________________________________________   [name] MireIncentive Spirometer  An incentive spirometer is a tool that can help keep your lungs clear and active. This tool measures how well you are filling your lungs with each breath. Taking long deep breaths may help reverse or decrease the chance of developing breathing (pulmonary) problems (especially infection) following:  A long period of time when you are unable to move or be active. BEFORE THE PROCEDURE   If the spirometer includes an indicator to show your best effort, your nurse or respiratory therapist  will set it to a desired goal.  If possible, sit up straight or lean slightly forward. Try not to slouch.  Hold the incentive spirometer in an upright position. INSTRUCTIONS FOR USE  1. Sit on the edge of your bed if possible, or sit up as far as you can in bed or on a chair. 2. Hold the incentive spirometer in an upright position. 3. Breathe out normally. 4. Place the mouthpiece in your mouth and seal your lips tightly around it. 5. Breathe in slowly and as deeply as possible, raising the piston or the ball toward the top of the column. 6. Hold your breath for 3-5 seconds or for as long as possible. Allow the piston or ball to fall to the bottom of the column. 7. Remove the mouthpiece from your mouth and breathe out normally. 8. Rest for a few seconds and  repeat Steps 1 through 7 at least 10 times every 1-2 hours when you are awake. Take your time and take a few normal breaths between deep breaths. 9. The spirometer may include an indicator to show your best effort. Use the indicator as a goal to work toward during each repetition. 10. After each set of 10 deep breaths, practice coughing to be sure your lungs are clear. If you have an incision (the cut made at the time of surgery), support your incision when coughing by placing a pillow or rolled up towels firmly against it. Once you are able to get out of bed, walk around indoors and cough well. You may stop using the incentive spirometer when instructed by your caregiver.  RISKS AND COMPLICATIONS  Take your time so you do not get dizzy or light-headed.  If you are in pain, you may need to take or ask for pain medication before doing incentive spirometry. It is harder to take a deep breath if you are having pain. AFTER USE  Rest and breathe slowly and easily.  It can be helpful to keep track of a log of your progress. Your caregiver can provide you with a simple table to help with this. If you are using the spirometer at home, follow  these instructions: SEEK MEDICAL CARE IF:   You are having difficultly using the spirometer.  You have trouble using the spirometer as often as instructed.  Your pain medication is not giving enough relief while using the spirometer.  You develop fever of 100.5 F (38.1 C) or higher. SEEK IMMEDIATE MEDICAL CARE IF:   You cough up bloody sputum that had not been present before.  You develop fever of 102 F (38.9 C) or greater.  You develop worsening pain at or near the incision site. MAKE SURE YOU:   Understand these instructions.  Will watch your condition.  Will get help right away if you are not doing well or get worse. Document Released: 10/11/2006 Document Revised: 08/23/2011 Document Reviewed: 12/12/2006 [facility] Patient Information 2014 [place], [place].   ________________________________________________________________________

## 2018-10-25 NOTE — Telephone Encounter (Signed)
Kara/[facility] called to state that patient requested [facility] as facility for PT and they do not take patient insurance [employer]. If its ok to find different provider.

## 2018-10-25 NOTE — Progress Notes (Signed)
SPOKE W/  _ patient   Denies the following below     SCREENING SYMPTOMS OF COVID 19:   COUGH--  RUNNY NOSE---   SORE THROAT---  NASAL CONGESTION----  SNEEZING----  SHORTNESS OF BREATH---  DIFFICULTY BREATHING---  TEMP >100.0 -----  UNEXPLAINED BODY ACHES------  CHILLS --------   HEADACHES ---------  LOSS OF SMELL/ TASTE --------    HAVE YOU OR ANY FAMILY MEMBER TRAVELLED PAST 14 DAYS OUT OF THE   COUNTY--- STATE---- COUNTRY----  HAVE YOU OR ANY FAMILY MEMBER BEEN EXPOSED TO ANYONE WITH COVID 19?     

## 2018-10-26 ENCOUNTER — Encounter (INDEPENDENT_AMBULATORY_CARE_PROVIDER_SITE_OTHER): Payer: Self-pay

## 2018-10-26 ENCOUNTER — Other Ambulatory Visit: Payer: Self-pay

## 2018-10-26 ENCOUNTER — Encounter (HOSPITAL_COMMUNITY): Payer: Self-pay

## 2018-10-26 ENCOUNTER — Telehealth: Payer: Self-pay | Admitting: Orthopaedic Surgery

## 2018-10-26 ENCOUNTER — Encounter (HOSPITAL_COMMUNITY)
Admission: RE | Admit: 2018-10-26 | Discharge: 2018-10-26 | Disposition: A | Discharge status: Home / Self Care | Payer: Managed Care, Other (non HMO) | Source: Ambulatory Visit | Attending: Orthopaedic Surgery | Admitting: Orthopaedic Surgery

## 2018-10-26 DIAGNOSIS — Z01818 Encounter for other preprocedural examination: Secondary | ICD-10-CM | POA: Diagnosis not present

## 2018-10-26 DIAGNOSIS — I10 Essential (primary) hypertension: Secondary | ICD-10-CM | POA: Diagnosis not present

## 2018-10-26 DIAGNOSIS — M1612 Unilateral primary osteoarthritis, left hip: Secondary | ICD-10-CM | POA: Insufficient documentation

## 2018-10-26 LAB — BASIC METABOLIC PANEL
Anion gap: 4 — ABNORMAL LOW (ref 5–15)
BUN: 11 mg/dL (ref 6–20)
CO2: 29 mmol/L (ref 22–32)
Calcium: 9 mg/dL (ref 8.9–10.3)
Chloride: 109 mmol/L (ref 98–111)
Creatinine, Ser: 1.28 mg/dL — ABNORMAL HIGH (ref 0.61–1.24)
GFR calc Af Amer: >60 mL/min (ref >60)
GFR calc non Af Amer: >60 mL/min (ref >60)
Glucose, Bld: 106 mg/dL — ABNORMAL HIGH (ref 70–99)
Potassium: 4.3 mmol/L (ref 3.5–5.1)
Sodium: 142 mmol/L (ref 135–145)

## 2018-10-26 LAB — SURGICAL PCR SCREEN
MRSA, PCR: POSITIVE — AB
Staphylococcus aureus: POSITIVE — AB

## 2018-10-26 LAB — CBC
HCT: 50 % (ref 39.0–52.0)
Hemoglobin: 16 g/dL (ref 13.0–17.0)
MCH: 27.2 pg (ref 26.0–34.0)
MCHC: 32 g/dL (ref 30.0–36.0)
MCV: 84.9 fL (ref 80.0–100.0)
Platelets: 234 10*3/uL (ref 150–400)
RBC: 5.89 MIL/uL — ABNORMAL HIGH (ref 4.22–5.81)
RDW: 14.7 % (ref 11.5–15.5)
WBC: 5.5 10*3/uL (ref 4.0–10.5)
nRBC: 0 % (ref 0.0–0.2)

## 2018-10-26 NOTE — Progress Notes (Signed)
Patient on coumadin for hx of reccurent DVTs Reports prescriber is his pcp Dr Romeo Rabon [phone] Last dose was today 10-26-2018

## 2018-10-26 NOTE — Telephone Encounter (Signed)
Received voicemail message from [place] with [facility] stating she found a provider for the patient. It is Diplomatic Services operational officer at [employer]. She asked that all demographics be faxed over to [facility] after surgery. The fax# is [phone]  The phone # is [phone]   The number to contact Diannia Ruder with [facility] is [phone]

## 2018-10-27 NOTE — Progress Notes (Signed)
Anesthesia Chart Review   Case:  [id number] Date/Time:  11/03/18 0800   Procedure:  LEFT TOTAL HIP ARTHROPLASTY ANTERIOR APPROACH (Left )   Anesthesia type:  Choice   Pre-op diagnosis:  osteoarthritis left hip   Location:  [facility] ROOM 10 / [facility]   Surgeon:  Kathryne Hitch, MD      DISCUSSION:58 yo never smoker with h/o HTN, GERD, right leg DVT s/o right THA 2018 (on Coumadin, reports last dose 10/26/2018), migraines, left hip OA scheduled for above procedure 11/03/2018 with Dr. Doneen Poisson.   Pt can proceed with planned procedure barring acute status change.  VS: BP 122/77 (BP Location: Left Arm)   Pulse 69   Temp 37.1 C (Oral)   Resp 18   Ht 5\' 6"  (1.676 m)   Wt 99.4 kg   SpO2 99%   BMI 35.36 kg/m   PROVIDERS: Romeo Rabon, MD is PCP    LABS: Labs reviewed: Acceptable for surgery. (all labs ordered are listed, but only abnormal results are displayed)  Labs Reviewed  SURGICAL PCR SCREEN - Abnormal; Notable for the following components:      Result Value   MRSA, PCR POSITIVE (*)    Staphylococcus aureus POSITIVE (*)    All other components within normal limits  BASIC METABOLIC PANEL - Abnormal; Notable for the following components:   Glucose, Bld 106 (*)    Creatinine, Ser 1.28 (*)    Anion gap 4 (*)    All other components within normal limits  CBC - Abnormal; Notable for the following components:   RBC 5.89 (*)    All other components within normal limits     IMAGES:   EKG: 10/26/2018 Rate 71 bpm Normal sinus rhythm   CV:  Past Medical History:  Diagnosis Date  . Arthritis    osteoarthritis-hips, knees  . DVT (deep venous thrombosis) (HCC)    right thigh s/p knee scope surgery  . Gastric ulcer    history of many yrs ago.  Marland Kitchen GERD (gastroesophageal reflux disease)    Protonix controls  . Headache    migraines periodically- once to twice monthly to none in a period of time.  . Hypertension   . Right leg DVT (HCC) 2017   s/p right THA  ; was temporarily on xarelto after knee scope dvt, then after right THA was started on coumadin due to multiple dvts in right leg      Past Surgical History:  Procedure Laterality Date  . KNEE ARTHROSCOPY Right    knee scope surgery  . TOTAL HIP ARTHROPLASTY Right 12/05/2015   Procedure: RIGHT TOTAL HIP ARTHROPLASTY ANTERIOR APPROACH;  Surgeon: Kathryne Hitch, MD;  Location: [facility];  Service: Orthopedics;  Laterality: Right;    MEDICATIONS: . acetaminophen (TYLENOL) 500 MG tablet  . cetirizine (ZYRTEC) 10 MG tablet  . cyclobenzaprine (FLEXERIL) 10 MG tablet  . losartan (COZAAR) 25 MG tablet  . omeprazole (PRILOSEC) 20 MG capsule  . oxyCODONE-acetaminophen (ROXICET) 5-325 MG tablet  . rivaroxaban (XARELTO) 10 MG TABS tablet  . rosuvastatin (CRESTOR) 20 MG tablet  . SUMAtriptan (IMITREX) 100 MG tablet  . tamsulosin (FLOMAX) 0.4 MG CAPS capsule  . warfarin (COUMADIN) 2 MG tablet   No current facility-administered medications for this encounter.    Janey Genta [facility] Pre-Surgical Testing [phone] 10/27/18 1:33 PM

## 2018-10-27 NOTE — Anesthesia Preprocedure Evaluation (Addendum)
Anesthesia Evaluation  Patient identified by MRN, date of birth, ID band Patient awake    Reviewed: Allergy & Precautions, NPO status , Patient's Chart, lab work & pertinent test results  Airway Mallampati: II  TM Distance: >3 FB Neck ROM: Full    Dental no notable dental hx. (+) Teeth Intact   Pulmonary neg pulmonary ROS,    Pulmonary exam normal breath sounds clear to auscultation       Cardiovascular hypertension, Pt. on medications + DVT  Normal cardiovascular exam Rhythm:Regular Rate:Normal  14 May 20 NSR R 71   Neuro/Psych    GI/Hepatic GERD  ,  Endo/Other    Renal/GU Cr 1.28 K+ 4.3     Musculoskeletal   Abdominal (+) + obese,   Peds  Hematology Hgb 16.0  Plt 234  INR 2.2   Anesthesia Other Findings   Reproductive/Obstetrics                          Anesthesia Physical Anesthesia Plan  ASA: III  Anesthesia Plan: General   Post-op Pain Management:    Induction:   PONV Risk Score and Plan: 3 and Ondansetron, Treatment may vary due to age or medical condition and Dexamethasone  Airway Management Planned: Oral ETT  Additional Equipment:   Intra-op Plan:   Post-operative Plan: Extubation in OR  Informed Consent: I have reviewed the patients History and Physical, chart, labs and discussed the procedure including the risks, benefits and alternatives for the proposed anesthesia with the patient or authorized representative who has indicated his/her understanding and acceptance.     Dental advisory given  Plan Discussed with: CRNA  Anesthesia Plan Comments: (See PAT note 10/26/2018, Jodell Cipro, PA-C  L THR w GA  As INR is 2.2 this AM)     Anesthesia Quick Evaluation

## 2018-10-30 NOTE — Telephone Encounter (Signed)
Faxed to provided number  

## 2018-10-31 ENCOUNTER — Other Ambulatory Visit (HOSPITAL_COMMUNITY)
Admission: RE | Admit: 2018-10-31 | Discharge: 2018-10-31 | Disposition: A | Discharge status: Home / Self Care | Payer: Managed Care, Other (non HMO) | Source: Ambulatory Visit | Attending: Orthopaedic Surgery | Admitting: Orthopaedic Surgery

## 2018-10-31 DIAGNOSIS — Z1159 Encounter for screening for other viral diseases: Secondary | ICD-10-CM | POA: Insufficient documentation

## 2018-11-01 LAB — NOVEL CORONAVIRUS, NAA (HOSP ORDER, SEND-OUT TO REF LAB; TAT 18-24 HRS): SARS-CoV-2, NAA: NOT DETECTED

## 2018-11-02 ENCOUNTER — Other Ambulatory Visit: Payer: Self-pay

## 2018-11-02 NOTE — Progress Notes (Signed)
SPOKE W/   Denies any of the following below   SCREENING SYMPTOMS OF COVID 19:   COUGH--  RUNNY NOSE---   SORE THROAT---  NASAL CONGESTION----  SNEEZING----  SHORTNESS OF BREATH---  DIFFICULTY BREATHING---  TEMP >100.0 -----  UNEXPLAINED BODY ACHES------  CHILLS --------   HEADACHES ---------  LOSS OF SMELL/ TASTE --------    HAVE YOU OR ANY FAMILY MEMBER TRAVELLED PAST 14 DAYS OUT OF THE   COUNTY--- STATE---- COUNTRY----  HAVE YOU OR ANY FAMILY MEMBER BEEN EXPOSED TO ANYONE WITH COVID 19?

## 2018-11-03 ENCOUNTER — Inpatient Hospital Stay (HOSPITAL_COMMUNITY): Payer: Managed Care, Other (non HMO)

## 2018-11-03 ENCOUNTER — Inpatient Hospital Stay (HOSPITAL_COMMUNITY): Payer: Managed Care, Other (non HMO) | Admitting: Physician Assistant

## 2018-11-03 ENCOUNTER — Encounter (HOSPITAL_COMMUNITY): Payer: Self-pay

## 2018-11-03 ENCOUNTER — Inpatient Hospital Stay (HOSPITAL_COMMUNITY)
Admission: RE | Admit: 2018-11-03 | Discharge: 2018-11-04 | DRG: 470 | Disposition: A | Discharge status: Home Health | Payer: Managed Care, Other (non HMO) | Attending: Orthopaedic Surgery | Admitting: Orthopaedic Surgery

## 2018-11-03 ENCOUNTER — Encounter (HOSPITAL_COMMUNITY)
Admission: RE | Disposition: A | Discharge status: Home Health | Payer: Self-pay | Source: Home / Self Care | Attending: Orthopaedic Surgery

## 2018-11-03 ENCOUNTER — Inpatient Hospital Stay (HOSPITAL_COMMUNITY): Payer: Managed Care, Other (non HMO) | Admitting: Certified Registered"

## 2018-11-03 DIAGNOSIS — I10 Essential (primary) hypertension: Secondary | ICD-10-CM | POA: Diagnosis present

## 2018-11-03 DIAGNOSIS — M1612 Unilateral primary osteoarthritis, left hip: Secondary | ICD-10-CM

## 2018-11-03 DIAGNOSIS — Z86718 Personal history of other venous thrombosis and embolism: Secondary | ICD-10-CM | POA: Diagnosis not present

## 2018-11-03 DIAGNOSIS — M25552 Pain in left hip: Secondary | ICD-10-CM | POA: Diagnosis present

## 2018-11-03 DIAGNOSIS — Z6835 Body mass index (BMI) 35.0-35.9, adult: Secondary | ICD-10-CM

## 2018-11-03 DIAGNOSIS — Z96642 Presence of left artificial hip joint: Secondary | ICD-10-CM

## 2018-11-03 DIAGNOSIS — K219 Gastro-esophageal reflux disease without esophagitis: Secondary | ICD-10-CM | POA: Diagnosis present

## 2018-11-03 DIAGNOSIS — Z886 Allergy status to analgesic agent status: Secondary | ICD-10-CM

## 2018-11-03 DIAGNOSIS — E6609 Other obesity due to excess calories: Secondary | ICD-10-CM | POA: Diagnosis present

## 2018-11-03 DIAGNOSIS — Z96641 Presence of right artificial hip joint: Secondary | ICD-10-CM | POA: Diagnosis present

## 2018-11-03 DIAGNOSIS — M25752 Osteophyte, left hip: Secondary | ICD-10-CM | POA: Diagnosis present

## 2018-11-03 DIAGNOSIS — Z419 Encounter for procedure for purposes other than remedying health state, unspecified: Secondary | ICD-10-CM

## 2018-11-03 HISTORY — PX: TOTAL HIP ARTHROPLASTY: SHX124

## 2018-11-03 LAB — PROTIME-INR
INR: 2.2 — ABNORMAL HIGH (ref 0.8–1.2)
Prothrombin Time: 24.5 seconds — ABNORMAL HIGH (ref 11.4–15.2)

## 2018-11-03 SURGERY — ARTHROPLASTY, HIP, TOTAL, ANTERIOR APPROACH
Anesthesia: General | Site: Hip | Laterality: Left

## 2018-11-03 MED ORDER — HYDROMORPHONE HCL 1 MG/ML IJ SOLN
INTRAMUSCULAR | Status: AC
  Administered 2018-11-03: 11:00:00 0.5 mg via INTRAVENOUS
  Filled 2018-11-03: qty 1

## 2018-11-03 MED ORDER — PHENYLEPHRINE 40 MCG/ML (10ML) SYRINGE FOR IV PUSH (FOR BLOOD PRESSURE SUPPORT)
PREFILLED_SYRINGE | INTRAVENOUS | Status: DC | PRN
  Administered 2018-11-03: 80 ug via INTRAVENOUS
  Administered 2018-11-03: 40 ug via INTRAVENOUS
  Administered 2018-11-03: 120 ug via INTRAVENOUS

## 2018-11-03 MED ORDER — SUMATRIPTAN SUCCINATE 50 MG PO TABS
50.0000 mg | ORAL_TABLET | ORAL | Status: DC | PRN
  Filled 2018-11-03: qty 1

## 2018-11-03 MED ORDER — ACETAMINOPHEN 10 MG/ML IV SOLN
INTRAVENOUS | Status: AC
  Filled 2018-11-03: qty 100

## 2018-11-03 MED ORDER — SODIUM CHLORIDE 0.9 % IR SOLN
Status: DC | PRN
  Administered 2018-11-03: 1000 mL

## 2018-11-03 MED ORDER — LIDOCAINE 2% (20 MG/ML) 5 ML SYRINGE
INTRAMUSCULAR | Status: DC | PRN
  Administered 2018-11-03: 100 mg via INTRAVENOUS

## 2018-11-03 MED ORDER — METOCLOPRAMIDE HCL 5 MG PO TABS: 5.0000 mg | ORAL_TABLET | Freq: Three times a day (TID) | ORAL | Status: DC | PRN

## 2018-11-03 MED ORDER — PHENYLEPHRINE 40 MCG/ML (10ML) SYRINGE FOR IV PUSH (FOR BLOOD PRESSURE SUPPORT)
PREFILLED_SYRINGE | INTRAVENOUS | Status: AC
  Filled 2018-11-03: qty 20

## 2018-11-03 MED ORDER — WARFARIN - PHARMACIST DOSING INPATIENT: Freq: Every day | Status: DC

## 2018-11-03 MED ORDER — METOCLOPRAMIDE HCL 5 MG/ML IJ SOLN: 5.0000 mg | Freq: Three times a day (TID) | INTRAMUSCULAR | Status: DC | PRN

## 2018-11-03 MED ORDER — GABAPENTIN 100 MG PO CAPS
100.0000 mg | ORAL_CAPSULE | Freq: Three times a day (TID) | ORAL | Status: DC
  Administered 2018-11-03 – 2018-11-04 (×3): 100 mg via ORAL
  Filled 2018-11-03 (×3): qty 1

## 2018-11-03 MED ORDER — SODIUM CHLORIDE 0.9 % IV SOLN
INTRAVENOUS | Status: DC
  Administered 2018-11-03: 17:00:00 via INTRAVENOUS

## 2018-11-03 MED ORDER — VANCOMYCIN HCL IN DEXTROSE 1-5 GM/200ML-% IV SOLN
1000.0000 mg | Freq: Once | INTRAVENOUS | Status: AC
  Administered 2018-11-03: 07:00:00 1000 mg via INTRAVENOUS
  Filled 2018-11-03: qty 200

## 2018-11-03 MED ORDER — CHLORHEXIDINE GLUCONATE 4 % EX LIQD: 60.0000 mL | Freq: Once | CUTANEOUS | Status: DC

## 2018-11-03 MED ORDER — KETAMINE HCL 10 MG/ML IJ SOLN
INTRAMUSCULAR | Status: AC
  Filled 2018-11-03: qty 1

## 2018-11-03 MED ORDER — DEXMEDETOMIDINE HCL 200 MCG/2ML IV SOLN
INTRAVENOUS | Status: DC | PRN
  Administered 2018-11-03: 12 ug via INTRAVENOUS

## 2018-11-03 MED ORDER — DEXAMETHASONE SODIUM PHOSPHATE 10 MG/ML IJ SOLN
INTRAMUSCULAR | Status: AC
  Filled 2018-11-03: qty 1

## 2018-11-03 MED ORDER — TAMSULOSIN HCL 0.4 MG PO CAPS
0.4000 mg | ORAL_CAPSULE | Freq: Every day | ORAL | Status: DC
  Administered 2018-11-03: 17:00:00 0.4 mg via ORAL
  Filled 2018-11-03: qty 1

## 2018-11-03 MED ORDER — MIDAZOLAM HCL 2 MG/2ML IJ SOLN
INTRAMUSCULAR | Status: AC
  Filled 2018-11-03: qty 2

## 2018-11-03 MED ORDER — DEXAMETHASONE SODIUM PHOSPHATE 10 MG/ML IJ SOLN
INTRAMUSCULAR | Status: DC | PRN
  Administered 2018-11-03: 8 mg via INTRAVENOUS

## 2018-11-03 MED ORDER — POLYETHYLENE GLYCOL 3350 17 G PO PACK: 17.0000 g | PACK | Freq: Every day | ORAL | Status: DC | PRN

## 2018-11-03 MED ORDER — HYDROMORPHONE HCL 1 MG/ML IJ SOLN
INTRAMUSCULAR | Status: DC | PRN
  Administered 2018-11-03: .6 mg via INTRAVENOUS
  Administered 2018-11-03: .4 mg via INTRAVENOUS
  Administered 2018-11-03: 0.5 mg via INTRAVENOUS

## 2018-11-03 MED ORDER — MEPERIDINE HCL 50 MG/ML IJ SOLN: 6.2500 mg | INTRAMUSCULAR | Status: DC | PRN

## 2018-11-03 MED ORDER — FENTANYL CITRATE (PF) 100 MCG/2ML IJ SOLN
INTRAMUSCULAR | Status: AC
  Filled 2018-11-03: qty 2

## 2018-11-03 MED ORDER — ONDANSETRON HCL 4 MG/2ML IJ SOLN
INTRAMUSCULAR | Status: DC | PRN
  Administered 2018-11-03: 4 mg via INTRAVENOUS

## 2018-11-03 MED ORDER — ONDANSETRON HCL 4 MG/2ML IJ SOLN: 4.0000 mg | Freq: Once | INTRAMUSCULAR | Status: DC | PRN

## 2018-11-03 MED ORDER — OXYCODONE HCL 5 MG PO TABS
5.0000 mg | ORAL_TABLET | ORAL | Status: DC | PRN
  Administered 2018-11-03 – 2018-11-04 (×2): 10 mg via ORAL
  Filled 2018-11-03 (×3): qty 2

## 2018-11-03 MED ORDER — PHENOL 1.4 % MT LIQD
1.0000 | OROMUCOSAL | Status: DC | PRN
  Filled 2018-11-03: qty 177

## 2018-11-03 MED ORDER — ACETAMINOPHEN 325 MG PO TABS
325.0000 mg | ORAL_TABLET | Freq: Four times a day (QID) | ORAL | Status: DC | PRN
  Administered 2018-11-04: 08:00:00 650 mg via ORAL
  Filled 2018-11-03: qty 2

## 2018-11-03 MED ORDER — KETAMINE HCL 10 MG/ML IJ SOLN
INTRAMUSCULAR | Status: DC | PRN
  Administered 2018-11-03: 40 mg via INTRAVENOUS

## 2018-11-03 MED ORDER — ACETAMINOPHEN 10 MG/ML IV SOLN
INTRAVENOUS | Status: DC | PRN
  Administered 2018-11-03: 1000 mg via INTRAVENOUS

## 2018-11-03 MED ORDER — METHOCARBAMOL 500 MG IVPB - SIMPLE MED
INTRAVENOUS | Status: AC
  Administered 2018-11-03: 11:00:00 500 mg via INTRAVENOUS
  Filled 2018-11-03: qty 50

## 2018-11-03 MED ORDER — METHOCARBAMOL 500 MG IVPB - SIMPLE MED
500.0000 mg | Freq: Four times a day (QID) | INTRAVENOUS | Status: DC | PRN
  Administered 2018-11-03: 11:00:00 500 mg via INTRAVENOUS
  Filled 2018-11-03: qty 50

## 2018-11-03 MED ORDER — HYDROMORPHONE HCL 1 MG/ML IJ SOLN
0.5000 mg | INTRAMUSCULAR | Status: DC | PRN
  Administered 2018-11-03: 15:00:00 1 mg via INTRAVENOUS
  Filled 2018-11-03: qty 1

## 2018-11-03 MED ORDER — SODIUM CHLORIDE 0.9 % IV SOLN
INTRAVENOUS | Status: DC | PRN
  Administered 2018-11-03: 25 ug/min via INTRAVENOUS

## 2018-11-03 MED ORDER — HYDROMORPHONE HCL 1 MG/ML IJ SOLN
0.2500 mg | INTRAMUSCULAR | Status: DC | PRN
  Administered 2018-11-03 (×4): 0.5 mg via INTRAVENOUS

## 2018-11-03 MED ORDER — WARFARIN SODIUM 4 MG PO TABS
4.0000 mg | ORAL_TABLET | Freq: Once | ORAL | Status: AC
  Administered 2018-11-03: 18:00:00 4 mg via ORAL
  Filled 2018-11-03: qty 1

## 2018-11-03 MED ORDER — MIDAZOLAM HCL 5 MG/5ML IJ SOLN
INTRAMUSCULAR | Status: DC | PRN
  Administered 2018-11-03: 2 mg via INTRAVENOUS

## 2018-11-03 MED ORDER — PHENYLEPHRINE HCL (PRESSORS) 10 MG/ML IV SOLN
INTRAVENOUS | Status: AC
  Filled 2018-11-03: qty 2

## 2018-11-03 MED ORDER — VANCOMYCIN HCL 1000 MG IV SOLR
1000.0000 mg | Freq: Two times a day (BID) | INTRAVENOUS | Status: AC
  Administered 2018-11-03: 17:00:00 1000 mg via INTRAVENOUS
  Filled 2018-11-03: qty 1000

## 2018-11-03 MED ORDER — DOCUSATE SODIUM 100 MG PO CAPS
100.0000 mg | ORAL_CAPSULE | Freq: Two times a day (BID) | ORAL | Status: DC
  Administered 2018-11-03 – 2018-11-04 (×2): 100 mg via ORAL
  Filled 2018-11-03 (×2): qty 1

## 2018-11-03 MED ORDER — CEFAZOLIN SODIUM-DEXTROSE 2-4 GM/100ML-% IV SOLN
2.0000 g | INTRAVENOUS | Status: AC
  Administered 2018-11-03: 2 g via INTRAVENOUS
  Filled 2018-11-03: qty 100

## 2018-11-03 MED ORDER — PROPOFOL 10 MG/ML IV BOLUS
INTRAVENOUS | Status: DC | PRN
  Administered 2018-11-03: 200 mg via INTRAVENOUS

## 2018-11-03 MED ORDER — METHOCARBAMOL 500 MG PO TABS
500.0000 mg | ORAL_TABLET | Freq: Four times a day (QID) | ORAL | Status: DC | PRN
  Administered 2018-11-04: 07:00:00 500 mg via ORAL
  Filled 2018-11-03 (×2): qty 1

## 2018-11-03 MED ORDER — PANTOPRAZOLE SODIUM 40 MG PO TBEC
40.0000 mg | DELAYED_RELEASE_TABLET | Freq: Every day | ORAL | Status: DC
  Administered 2018-11-03 – 2018-11-04 (×2): 40 mg via ORAL
  Filled 2018-11-03 (×2): qty 1

## 2018-11-03 MED ORDER — VANCOMYCIN HCL IN DEXTROSE 1-5 GM/200ML-% IV SOLN
1000.0000 mg | Freq: Two times a day (BID) | INTRAVENOUS | Status: DC
  Filled 2018-11-03: qty 200

## 2018-11-03 MED ORDER — LOSARTAN POTASSIUM 25 MG PO TABS
25.0000 mg | ORAL_TABLET | Freq: Every day | ORAL | Status: DC
  Administered 2018-11-04: 08:00:00 25 mg via ORAL
  Filled 2018-11-03: qty 1

## 2018-11-03 MED ORDER — ACETAMINOPHEN 10 MG/ML IV SOLN: 1000.0000 mg | Freq: Once | INTRAVENOUS | Status: DC | PRN

## 2018-11-03 MED ORDER — GLYCOPYRROLATE PF 0.2 MG/ML IJ SOSY
PREFILLED_SYRINGE | INTRAMUSCULAR | Status: DC | PRN
  Administered 2018-11-03: .2 mg via INTRAVENOUS

## 2018-11-03 MED ORDER — MUPIROCIN 2 % EX OINT
TOPICAL_OINTMENT | Freq: Two times a day (BID) | CUTANEOUS | Status: DC
  Administered 2018-11-03 – 2018-11-04 (×3): via NASAL
  Filled 2018-11-03 (×2): qty 22

## 2018-11-03 MED ORDER — ALUM & MAG HYDROXIDE-SIMETH 200-200-20 MG/5ML PO SUSP: 30.0000 mL | ORAL | Status: DC | PRN

## 2018-11-03 MED ORDER — LACTATED RINGERS IV SOLN
INTRAVENOUS | Status: DC
  Administered 2018-11-03 (×2): via INTRAVENOUS

## 2018-11-03 MED ORDER — SUGAMMADEX SODIUM 200 MG/2ML IV SOLN
INTRAVENOUS | Status: DC | PRN
  Administered 2018-11-03: 300 mg via INTRAVENOUS

## 2018-11-03 MED ORDER — 0.9 % SODIUM CHLORIDE (POUR BTL) OPTIME
TOPICAL | Status: DC | PRN
  Administered 2018-11-03: 09:00:00 1000 mL

## 2018-11-03 MED ORDER — HYDROMORPHONE HCL 2 MG/ML IJ SOLN
INTRAMUSCULAR | Status: AC
  Filled 2018-11-03: qty 1

## 2018-11-03 MED ORDER — ROSUVASTATIN CALCIUM 20 MG PO TABS
20.0000 mg | ORAL_TABLET | Freq: Every day | ORAL | Status: DC
  Administered 2018-11-03: 22:00:00 20 mg via ORAL
  Filled 2018-11-03: qty 1

## 2018-11-03 MED ORDER — PROPOFOL 10 MG/ML IV BOLUS
INTRAVENOUS | Status: AC
  Filled 2018-11-03: qty 60

## 2018-11-03 MED ORDER — ONDANSETRON HCL 4 MG/2ML IJ SOLN: 4.0000 mg | Freq: Four times a day (QID) | INTRAMUSCULAR | Status: DC | PRN

## 2018-11-03 MED ORDER — TRANEXAMIC ACID-NACL 1000-0.7 MG/100ML-% IV SOLN
1000.0000 mg | INTRAVENOUS | Status: AC
  Administered 2018-11-03: 1000 mg via INTRAVENOUS
  Filled 2018-11-03: qty 100

## 2018-11-03 MED ORDER — HYDROMORPHONE HCL 1 MG/ML IJ SOLN
0.2500 mg | INTRAMUSCULAR | Status: DC | PRN
  Administered 2018-11-03: 11:00:00 0.5 mg via INTRAVENOUS

## 2018-11-03 MED ORDER — DIPHENHYDRAMINE HCL 12.5 MG/5ML PO ELIX: 12.5000 mg | ORAL_SOLUTION | ORAL | Status: DC | PRN

## 2018-11-03 MED ORDER — ONDANSETRON HCL 4 MG PO TABS: 4.0000 mg | ORAL_TABLET | Freq: Four times a day (QID) | ORAL | Status: DC | PRN

## 2018-11-03 MED ORDER — POVIDONE-IODINE 10 % EX SWAB
2.0000 "application " | Freq: Once | CUTANEOUS | Status: AC
  Administered 2018-11-03: 2 via TOPICAL

## 2018-11-03 MED ORDER — EPHEDRINE SULFATE-NACL 50-0.9 MG/10ML-% IV SOSY
PREFILLED_SYRINGE | INTRAVENOUS | Status: DC | PRN
  Administered 2018-11-03 (×4): 5 mg via INTRAVENOUS

## 2018-11-03 MED ORDER — GLYCOPYRROLATE PF 0.2 MG/ML IJ SOSY
PREFILLED_SYRINGE | INTRAMUSCULAR | Status: AC
  Filled 2018-11-03: qty 1

## 2018-11-03 MED ORDER — OXYCODONE HCL 5 MG PO TABS
10.0000 mg | ORAL_TABLET | ORAL | Status: DC | PRN
  Administered 2018-11-03: 12:00:00 15 mg via ORAL
  Administered 2018-11-03 – 2018-11-04 (×2): 10 mg via ORAL
  Filled 2018-11-03: qty 2
  Filled 2018-11-03: qty 3

## 2018-11-03 MED ORDER — SUCCINYLCHOLINE CHLORIDE 200 MG/10ML IV SOSY
PREFILLED_SYRINGE | INTRAVENOUS | Status: DC | PRN
  Administered 2018-11-03: 100 mg via INTRAVENOUS

## 2018-11-03 MED ORDER — HYDROMORPHONE HCL 1 MG/ML IJ SOLN
INTRAMUSCULAR | Status: AC
  Filled 2018-11-03: qty 1

## 2018-11-03 MED ORDER — ONDANSETRON HCL 4 MG/2ML IJ SOLN
INTRAMUSCULAR | Status: AC
  Filled 2018-11-03: qty 2

## 2018-11-03 MED ORDER — ROCURONIUM BROMIDE 10 MG/ML (PF) SYRINGE
PREFILLED_SYRINGE | INTRAVENOUS | Status: DC | PRN
  Administered 2018-11-03: 50 mg via INTRAVENOUS
  Administered 2018-11-03: 10 mg via INTRAVENOUS

## 2018-11-03 MED ORDER — LIDOCAINE 2% (20 MG/ML) 5 ML SYRINGE
INTRAMUSCULAR | Status: AC
  Filled 2018-11-03: qty 5

## 2018-11-03 MED ORDER — FENTANYL CITRATE (PF) 100 MCG/2ML IJ SOLN
INTRAMUSCULAR | Status: DC | PRN
  Administered 2018-11-03 (×5): 50 ug via INTRAVENOUS

## 2018-11-03 MED ORDER — MENTHOL 3 MG MT LOZG: 1.0000 | LOZENGE | OROMUCOSAL | Status: DC | PRN

## 2018-11-03 SURGICAL SUPPLY — 40 items
BAG ZIPLOCK 12X15 (MISCELLANEOUS) ×3 IMPLANT
BENZOIN TINCTURE PRP APPL 2/3 (GAUZE/BANDAGES/DRESSINGS) IMPLANT
BLADE SAW SGTL 18X1.27X75 (BLADE) ×2 IMPLANT
BLADE SAW SGTL 18X1.27X75MM (BLADE) ×1
BLADE SURG SZ10 CARB STEEL (BLADE) ×6 IMPLANT
CLOSURE WOUND 1/2 X4 (GAUZE/BANDAGES/DRESSINGS)
COVER PERINEAL POST (MISCELLANEOUS) ×3 IMPLANT
COVER SURGICAL LIGHT HANDLE (MISCELLANEOUS) ×3 IMPLANT
COVER WAND RF STERILE (DRAPES) IMPLANT
CUP ACET PNNCL SECTR W/GRIP 56 (Hips) ×1 IMPLANT
DRAPE STERI IOBAN 125X83 (DRAPES) ×3 IMPLANT
DRAPE U-SHAPE 47X51 STRL (DRAPES) ×6 IMPLANT
DRSG AQUACEL AG ADV 3.5X10 (GAUZE/BANDAGES/DRESSINGS) ×3 IMPLANT
DURAPREP 26ML APPLICATOR (WOUND CARE) ×3 IMPLANT
ELECT BLADE TIP CTD 4 INCH (ELECTRODE) ×3 IMPLANT
ELECT REM PT RETURN 15FT ADLT (MISCELLANEOUS) ×3 IMPLANT
GAUZE XEROFORM 1X8 LF (GAUZE/BANDAGES/DRESSINGS) ×3 IMPLANT
GLOVE BIO SURGEON STRL SZ7.5 (GLOVE) ×3 IMPLANT
GLOVE BIOGEL PI IND STRL 8 (GLOVE) ×2 IMPLANT
GLOVE BIOGEL PI INDICATOR 8 (GLOVE) ×4
GLOVE ECLIPSE 8.0 STRL XLNG CF (GLOVE) ×3 IMPLANT
GOWN STRL REUS W/TWL XL LVL3 (GOWN DISPOSABLE) ×6 IMPLANT
HANDPIECE INTERPULSE COAX TIP (DISPOSABLE) ×2
HEAD CERAMIC 36 PLUS5 (Hips) ×3 IMPLANT
HOLDER FOLEY CATH W/STRAP (MISCELLANEOUS) ×3 IMPLANT
KIT TURNOVER KIT A (KITS) ×3 IMPLANT
PACK ANTERIOR HIP CUSTOM (KITS) ×3 IMPLANT
PINN SECTOR W/GRIP ACE CUP 56 (Hips) ×3 IMPLANT
PINNACLE ALTRX PLUS 4 N 36X56 (Hips) ×3 IMPLANT
SET HNDPC FAN SPRY TIP SCT (DISPOSABLE) ×1 IMPLANT
STEM CORAIL KLA12 (Stem) ×3 IMPLANT
STRIP CLOSURE SKIN 1/2X4 (GAUZE/BANDAGES/DRESSINGS) IMPLANT
SUT ETHIBOND NAB CT1 #1 30IN (SUTURE) ×3 IMPLANT
SUT MNCRL AB 4-0 PS2 18 (SUTURE) IMPLANT
SUT VIC AB 0 CT1 36 (SUTURE) ×3 IMPLANT
SUT VIC AB 1 CT1 36 (SUTURE) ×3 IMPLANT
SUT VIC AB 2-0 CT1 27 (SUTURE) ×4
SUT VIC AB 2-0 CT1 TAPERPNT 27 (SUTURE) ×2 IMPLANT
TRAY FOLEY MTR SLVR 16FR STAT (SET/KITS/TRAYS/PACK) ×3 IMPLANT
YANKAUER SUCT BULB TIP 10FT TU (MISCELLANEOUS) ×3 IMPLANT

## 2018-11-03 NOTE — H&P (Signed)
TOTAL HIP ADMISSION H&P  Patient is admitted for left total hip arthroplasty.  Subjective:  Chief Complaint: left hip pain  HPI: [name] [name], 58 y.o. male, has a history of pain and functional disability in the left hip(s) due to arthritis and patient has failed non-surgical conservative treatments for greater than 12 weeks to include NSAID's and/or analgesics, corticosteriod injections, flexibility and strengthening excercises, use of assistive devices, weight reduction as appropriate and activity modification.  Onset of symptoms was gradual starting 2 years ago with gradually worsening course since that time.The patient noted no past surgery on the left hip(s).  Patient currently rates pain in the left hip at 10 out of 10 with activity. Patient has night pain, worsening of pain with activity and weight bearing, trendelenberg gait, pain that interfers with activities of daily living and pain with passive range of motion. Patient has evidence of subchondral sclerosis, periarticular osteophytes and joint space narrowing by imaging studies. This condition presents safety issues increasing the risk of falls.  There is no current active infection.  Patient Active Problem List   Diagnosis Date Noted  . Unilateral primary osteoarthritis, left hip 11/03/2018  . Osteoarthritis of right hip 12/05/2015  . 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 Arthritis    osteoarthritis-hips, knees  . DVT (deep venous thrombosis) (HCC)    right thigh s/p knee scope surgery  . Gastric ulcer    history of many yrs ago.  Marland Kitchen GERD (gastroesophageal reflux disease)    Protonix controls  . Headache    migraines periodically- once to twice monthly to none in a period of time.  . Hypertension   . Right leg DVT (HCC) 2017   s/p right THA ; was temporarily on xarelto after knee scope dvt, then after right THA was started on coumadin due to multiple dvts in right  leg      Past Surgical History:  Procedure Laterality Date  . KNEE ARTHROSCOPY Right    knee scope surgery  . TOTAL HIP ARTHROPLASTY Right 12/05/2015   Procedure: RIGHT TOTAL HIP ARTHROPLASTY ANTERIOR APPROACH;  Surgeon: Kathryne Hitch, MD;  Location[facility];  Service: Orthopedics;  Laterality: Right;    Current Facility-Administered Medications  Medication Dose Route Frequency Provider Last Rate Last Dose  . ceFAZolin (ANCEF) IVPB 2g/100 mL premix  2 g Intravenous On Call to OR Kirtland Bouchard, PA-C      . chlorhexidine (HIBICLENS) 4 % liquid 4 application  60 mL Topical Once Kirtland Bouchard, PA-C      . lactated ringers infusion   Intravenous Continuous Eilene Ghazi, MD 10 mL/hr at 11/03/18 [phone]    . tranexamic acid (CYKLOKAPRON) IVPB 1,000 mg  1,000 mg Intravenous To OR Kirtland Bouchard, PA-C      . vancomycin (VANCOCIN) IVPB 1000 mg/200 mL premix  1,000 mg Intravenous Once Terrilee Files T, RPH 200 mL/hr at 11/03/18 0638 1,000 mg at 11/03/18 9357   Allergies  Allergen Reactions  . Nsaids Other (See Comments)    Per nephrologist to avoid NSAIDs    Social History   Tobacco Use  . Smoking status: Never Smoker  . Smokeless tobacco: Never Used  Substance Use Topics  . Alcohol use: Yes    Comment: occ     History reviewed. No pertinent family history.   Review of Systems  Musculoskeletal: Positive for joint pain.  All other systems reviewed and are negative.  Objective:  Physical Exam  Constitutional: He is oriented to person, place, and time. He appears well-developed and well-nourished.  HENT:  Head: Normocephalic and atraumatic.  Eyes: Pupils are equal, round, and reactive to light. EOM are normal.  Neck: Normal range of motion. Neck supple.  Cardiovascular: Normal rate and regular rhythm.  Respiratory: Effort normal.  GI: Soft.  Musculoskeletal:     Left hip: He exhibits decreased range of motion, decreased strength, tenderness and bony tenderness.   Neurological: He is alert and oriented to person, place, and time.  Skin: Skin is warm.  Psychiatric: He has a normal mood and affect.    Vital signs in last 24 hours: Temp:  [98.1 F (36.7 C)] 98.1 F (36.7 C) (05/22 0604) Pulse Rate:  [75] 75 (05/22 0604) Resp:  [19] 19 (05/22 0604) BP: (128)/(84) 128/84 (05/22 0604) SpO2:  [99 %] 99 % (05/22 0604) Weight:  [99.4 kg] 99.4 kg (05/22 0600)  Labs:   Estimated body mass index is 35.36 kg/m as calculated from the following:   Height as of this encounter: 5\' 6"  (1.676 m).   Weight as of this encounter: 99.4 kg.   Imaging Review Plain radiographs demonstrate severe degenerative joint disease of the left hip(s). The bone quality appears to be excellent for age and reported activity level.      Assessment/Plan:  End stage arthritis, left hip(s)  The patient history, physical examination, clinical judgement of the provider and imaging studies are consistent with end stage degenerative joint disease of the left hip(s) and total hip arthroplasty is deemed medically necessary. The treatment options including medical management, injection therapy, arthroscopy and arthroplasty were discussed at length. The risks and benefits of total hip arthroplasty were presented and reviewed. The risks due to aseptic loosening, infection, stiffness, dislocation/subluxation,  thromboembolic complications and other imponderables were discussed.  The patient acknowledged the explanation, agreed to proceed with the plan and consent was signed. Patient is being admitted for inpatient treatment for surgery, pain control, PT, OT, prophylactic antibiotics, VTE prophylaxis, progressive ambulation and ADL's and discharge planning.The patient is planning to be discharged home with home health services

## 2018-11-03 NOTE — TOC Progression Note (Signed)
Transition of Care [facility]) - Progression Note    Patient Details  Name: Erza Yoffe MRN: [id number] Date of Birth: 22-Nov-1960  [facility] CM/SW Contact  Golda Acre, RN Phone Number: 11/03/2018, 2:41 PM  Clinical Narrative:    TCf-cacye with [employer], made aswqare of hhc order for PT approved through [facility]/will need dc summary and date of discharge faxed to [phone].  Phone number is [phone]        Expected Discharge Plan and Services           Expected Discharge Date: 11/04/18                                     Social Determinants of Health (SDOH) Interventions    Readmission Risk Interventions No flowsheet data found.

## 2018-11-03 NOTE — Transfer of Care (Signed)
Immediate Anesthesia Transfer of Care Note  Patient: [name] [name]  Procedure(s) Performed: LEFT TOTAL HIP ARTHROPLASTY ANTERIOR APPROACH (Left Hip)  Patient Location: PACU  Anesthesia Type:General  Level of Consciousness: awake, patient cooperative and responds to stimulation  Airway & Oxygen Therapy: Patient Spontanous Breathing and Patient connected to face mask oxygen  Post-op Assessment: Report given to RN and Post -op Vital signs reviewed and stable  Post vital signs: Reviewed and stable  Last Vitals:  Vitals Value Taken Time  BP 114/82 11/03/2018 10:30 AM  Temp    Pulse 80 11/03/2018 10:33 AM  Resp 13 11/03/2018 10:33 AM  SpO2 98 % 11/03/2018 10:33 AM  Vitals shown include unvalidated device data.  Last Pain:  Vitals:   11/03/18 0624  TempSrc:   PainSc: 0-No pain         Complications: No apparent anesthesia complications

## 2018-11-03 NOTE — Brief Op Note (Signed)
11/03/2018  10:03 AM  PATIENT:  [name] [name]  58 y.o. male  PRE-OPERATIVE DIAGNOSIS:  osteoarthritis left hip  POST-OPERATIVE DIAGNOSIS:  osteoarthritis left hip  PROCEDURE:  Procedure(s): LEFT TOTAL HIP ARTHROPLASTY ANTERIOR APPROACH (Left)  SURGEON:  Surgeon(s) and Role:    Kathryne Hitch, MD - Primary  PHYSICIAN ASSISTANT: Rexene Edison, PA-C   ANESTHESIA:   general  EBL:  500 mL   COUNTS:  YES  DICTATION: .Other Dictation: Dictation Number [id number]  PLAN OF CARE: Admit to inpatient   PATIENT DISPOSITION:  PACU - hemodynamically stable.   Delay start of Pharmacological VTE agent (>24hrs) due to surgical blood loss or risk of bleeding: no

## 2018-11-03 NOTE — Anesthesia Procedure Notes (Signed)
Procedure Name: Intubation Date/Time: 11/03/2018 8:44 AM Performed by: Silas Sacramento, CRNA Pre-anesthesia Checklist: Patient identified, Emergency Drugs available, Suction available and Patient being monitored Patient Re-evaluated:Patient Re-evaluated prior to induction Oxygen Delivery Method: Circle system utilized Preoxygenation: Pre-oxygenation with 100% oxygen Induction Type: IV induction and Rapid sequence Laryngoscope Size: Mac and 4 Grade View: Grade I Tube type: Oral Tube size: 7.5 mm Number of attempts: 1 Airway Equipment and Method: Stylet Placement Confirmation: ETT inserted through vocal cords under direct vision,  positive ETCO2 and breath sounds checked- equal and bilateral Secured at: 23 cm Tube secured with: Tape Dental Injury: Teeth and Oropharynx as per pre-operative assessment

## 2018-11-03 NOTE — Evaluation (Signed)
Physical Therapy Evaluation Patient Details Name: [name] [name] MRN: [id number] DOB: [birth year] Today's Date: 11/03/2018   History of Present Illness  58 yo male s/p L DA-THA on 11/03/18. PMH includes OA, DVT, GERD, HTN, R THA, R knee arthroscopy.  Clinical Impression  Pt presents with L hip pain, L hip weakness post-surgery, difficulty performing bed mobility, and decreased activity tolerance post-surgery. Pt to benefit from acute PT to address deficits. Pt ambulated hallway distance with RW with min guard assist, pt with good form and safety during ambulation. Pt educated on ankle pumps (20/hour) to perform this afternoon/evening to increase circulation, to pt's tolerance and limited by pain. Pt with history of DVT following orthopedic surgery, pt educated on importance of mobility post-operatively. PT to progress mobility as tolerated, and will continue to follow acutely.        Follow Up Recommendations Follow surgeon's recommendation for DC plan and follow-up therapies;Supervision for mobility/OOB(HHPT)    Equipment Recommendations  None recommended by PT    Recommendations for Other Services       Precautions / Restrictions Precautions Precautions: Fall Restrictions Weight Bearing Restrictions: No Other Position/Activity Restrictions: WBAT      Mobility  Bed Mobility Overal bed mobility: Needs Assistance Bed Mobility: Supine to Sit     Supine to sit: Min assist;HOB elevated     General bed mobility comments: Min assist for LLE lifting and translation to EOB, pt with increased time and effort to scoot to EOB.   Transfers Overall transfer level: Needs assistance Equipment used: Rolling walker (2 wheeled) Transfers: Sit to/from Stand Sit to Stand: Min guard;From elevated surface         General transfer comment: Min guard for safety, pt with good form upon rising no VC needed for hand placement.   Ambulation/Gait Ambulation/Gait assistance: Min guard;+2  safety/equipment(chair follow) Gait Distance (Feet): 70 Feet Assistive device: Rolling walker (2 wheeled) Gait Pattern/deviations: Step-to pattern;Step-through pattern;Decreased stride length Gait velocity: decr    General Gait Details: Min guard for safety, pt with proper form and use of RW. Verbal cuing for sequencing.  Stairs            Wheelchair Mobility    Modified Rankin (Stroke Patients Only)       Balance Overall balance assessment: Mild deficits observed, not formally tested                                           Pertinent Vitals/Pain Pain Assessment: 0-10 Pain Score: 5  Pain Location: L hip  Pain Descriptors / Indicators: Discomfort;Aching Pain Intervention(s): Limited activity within patient's tolerance;Monitored during session;Repositioned;Premedicated before session;Ice applied    Home Living Family/patient expects to be discharged to:: Private residence Living Arrangements: Alone Available Help at Discharge: Family;Available PRN/intermittently(dad and sister) Type of Home: House Home Access: Level entry     Home Layout: One level Home Equipment: Walker - 2 wheels;Cane - single point;Bedside commode;Crutches      Prior Function Level of Independence: Independent               Hand Dominance   Dominant Hand: Right    Extremity/Trunk Assessment   Upper Extremity Assessment Upper Extremity Assessment: Overall WFL for tasks assessed    Lower Extremity Assessment Lower Extremity Assessment: Overall WFL for tasks assessed;LLE deficits/detail LLE Deficits / Details: suspected post-surgical hip weakness; able to perform ankle pumps, quad set,  heel slide with assist, SLR with lift assist during bed mobility LLE Sensation: WNL    Cervical / Trunk Assessment Cervical / Trunk Assessment: Normal  Communication   Communication: No difficulties  Cognition Arousal/Alertness: Awake/alert Behavior During Therapy: WFL for  tasks assessed/performed Overall Cognitive Status: Within Functional Limits for tasks assessed                                        General Comments      Exercises     Assessment/Plan    PT Assessment Patient needs continued PT services  PT Problem List Decreased strength;Decreased mobility;Decreased range of motion;Decreased balance;Decreased knowledge of use of DME;Pain;Decreased activity tolerance       PT Treatment Interventions DME instruction;Functional mobility training;Balance training;Patient/family education;Gait training;Therapeutic activities;Therapeutic exercise    PT Goals (Current goals can be found in the Care Plan section)  Acute Rehab PT Goals Patient Stated Goal: go home PT Goal Formulation: With patient Time For Goal Achievement: 11/10/18 Potential to Achieve Goals: Good    Frequency 7X/week   Barriers to discharge        Co-evaluation               AM-PAC PT "6 Clicks" Mobility  Outcome Measure Help needed turning from your back to your side while in a flat bed without using bedrails?: A Little Help needed moving from lying on your back to sitting on the side of a flat bed without using bedrails?: A Little Help needed moving to and from a bed to a chair (including a wheelchair)?: A Little Help needed standing up from a chair using your arms (e.g., wheelchair or bedside chair)?: A Little Help needed to walk in hospital room?: A Little Help needed climbing 3-5 steps with a railing? : A Lot 6 Click Score: 17    End of Session Equipment Utilized During Treatment: Gait belt Activity Tolerance: Patient tolerated treatment well;Patient limited by pain Patient left: in chair;with chair alarm set;with call bell/phone within reach;with SCD's reapplied Nurse Communication: Mobility status PT Visit Diagnosis: Other abnormalities of gait and mobility (R26.89);Difficulty in walking, not elsewhere classified (R26.2)    Time:  8144-8185 PT Time Calculation (min) (ACUTE ONLY): 21 min   Charges:   PT Evaluation $PT Eval Low Complexity: 1 Low         Avni Traore Terrial Rhodes, PT Acute Rehabilitation Services Pager [phone]  Office [phone]   Tyrone Apple D Despina Hidden 11/03/2018, 5:26 PM

## 2018-11-03 NOTE — Anesthesia Postprocedure Evaluation (Signed)
Anesthesia Post Note  Patient: [name] [name]  Procedure(s) Performed: LEFT TOTAL HIP ARTHROPLASTY ANTERIOR APPROACH (Left Hip)     Patient location during evaluation: PACU Anesthesia Type: General Level of consciousness: awake and alert Pain management: pain level controlled Vital Signs Assessment: post-procedure vital signs reviewed and stable Respiratory status: spontaneous breathing, nonlabored ventilation, respiratory function stable and patient connected to nasal cannula oxygen Cardiovascular status: blood pressure returned to baseline and stable Postop Assessment: no apparent nausea or vomiting Anesthetic complications: no    Last Vitals:  Vitals:   11/03/18 1200 11/03/18 1212  BP: 118/76 135/90  Pulse: 72 88  Resp: 14 16  Temp:  36.9 C  SpO2: 100% 100%    Last Pain:  Vitals:   11/03/18 1226  TempSrc:   PainSc: 9                  Trevor Iha

## 2018-11-03 NOTE — Progress Notes (Signed)
ANTICOAGULATION CONSULT NOTE - Initial Consult  Pharmacy Consult for warfarin Indication: Resuming warfarin post-op, hx VTE  Allergies  Allergen Reactions  . Nsaids Other (See Comments)    Per nephrologist to avoid NSAIDs    Patient Measurements: Height: 5\' 6"  (167.6 cm) Weight: 219 lb 1 oz (99.4 kg) IBW/kg (Calculated) : 63.8  Vital Signs: Temp: 98.5 F (36.9 C) (05/22 1212) Temp Source: Oral (05/22 1212) BP: 135/90 (05/22 1212) Pulse Rate: 88 (05/22 1212)  Labs: Recent Labs    11/03/18 0601  LABPROT 24.5*  INR 2.2*    Estimated Creatinine Clearance: 71.1 mL/min (A) (by C-G formula based on SCr of 1.28 mg/dL (H)).   Medical History: Past Medical History:  Diagnosis Date  . Arthritis    osteoarthritis-hips, knees  . DVT (deep venous thrombosis) (HCC)    right thigh s/p knee scope surgery  . Gastric ulcer    history of many yrs ago.  Marland Kitchen GERD (gastroesophageal reflux disease)    Protonix controls  . Headache    migraines periodically- once to twice monthly to none in a period of time.  . Hypertension   . Right leg DVT (HCC) 2017   s/p right THA ; was temporarily on xarelto after knee scope dvt, then after right THA was started on coumadin due to multiple dvts in right leg      Assessment: Pharmacy consulted to dose and monitor warfarin in this 58 year old male during his hospital admission. Patient underwent left total hip arthroplasty today (11/03/18). Patient was on warfarin PTA for hx VTE.  Home dose of warfarin: 4 mg PO daily (confirmed with patient) INR goal: 2-3 Last warfarin dose PTA: 11/01/18 (pt reported)  Today, 11/03/18  INR 2.2 is therapeutic  No current medications ordered that significantly interact with warfarin  CBC ordered with AM labs tomorrow per MD.  Labs on 10/26/18: Hgb 16, Plt 234   Per discussion with MD, resume warfarin this evening. No LMWH at this time.   Goal of Therapy:  INR 2-3 Monitor platelets by anticoagulation  protocol: Yes   Plan:   Warfarin 4 mg PO x1 this evening  CBC, INR with AM labs tomorrow  Monitor patient for signs/symptoms of bleeding or thrombosis  Cindi Carbon, PharmD 11/03/2018,1:10 PM

## 2018-11-03 NOTE — Op Note (Signed)
NAMLowella Dandy: [name], [name] MEDICAL RECORD [id number] ACCOUNT [id number] DATE OF BIRTH:[birth year] FACILITY: [facility] LOCATION: [facility] PHYSICIAN:Ilias Stcharles Aretha ParrotY. Lillis Nuttle, MD  OPERATIVE REPORT  DATE OF PROCEDURE:  11/03/2018  PREOPERATIVE DIAGNOSIS:  Primary osteoarthritis and degenerative joint disease, left hip.  POSTOPERATIVE DIAGNOSIS:  Primary osteoarthritis and degenerative joint disease, left hip.  PROCEDURE:  Left total hip arthroplasty through direct anterior approach.  IMPLANTS:  DePuy Sector Gription acetabular component size 56, size 36+4 polyethylene liner, size 12 Corail femoral component with varus offset, size 36+5 ceramic hip ball.  SURGEON:  Vanita PandaChristopher Y. Magnus IvanBlackman, MD  ASSISTANT:  Richardean CanalGilbert Clark, PA-C  ANESTHESIA:  General.  ANTIBIOTICS:  Two grams IV Ancef.  ESTIMATED BLOOD LOSS:  500 mL.  COMPLICATIONS:  None.  INDICATIONS:  The patient is a 58 year old solid gentleman of thick stature who has debilitating arthritis involving his left hip.  We actually replaced his right hip in 2017.  His left hip now shows bone-on-bone wear on x-rays.  His pain has  detrimentally affected his mobility, his quality of life, and his activities of daily living.  At this point, he does wish to proceed with a total hip arthroplasty on the left side given the success of his surgery on the right side and given the severity  of the arthritis and how this is impacting his life.  At this point, we have set him up for surgery for today.  We had a long and a thorough discussion about the risk of acute blood loss anemia, nerve or vessel injury, fracture, infection, dislocation,  DVT and implant failure.  He understands our goals are to decrease pain, improve mobility and overall improve quality of life.  DESCRIPTION OF PROCEDURE:  After informed consent was obtained and appropriate left hip was marked, he was brought to the operating room where general anesthesia was obtained while he was on a  stretcher.  A Foley catheter was placed, and both feet had  traction boots applied to them.  Next, he was placed supine on the Hana fracture table with a perineal post in place and both legs in in-line skeletal traction device and no traction applied.  His left operative hip was then prepped and draped with  DuraPrep and sterile drapes.  A time-out was called.  He was identified as correct patient, correct left hip.  Of note, I did assess his leg lengths prior to putting him on the table, and he was just slightly shorter preoperative on his left than the  right side.  Once he was placed on the Hana fracture table, his left hip was prepped and draped with DuraPrep and sterile drapes.  A time-out was called.  He was identified as correct patient, correct left hip.  I then made an incision just inferior and  posterior to the anterior superior iliac spine and carried this obliquely down the leg.  We dissected down the tensor fascia lata muscle.  The tensor fascia was then divided longitudinally to proceed with a direct anterior approach to the hip.  We  identified and cauterized circumflex vessels and identified the hip capsule, opened up the hip capsule in an L-type format, finding a moderate joint effusion and significant periarticular osteophytes around the femoral head and neck.  We made our femoral  neck cut with an oscillating saw and completed this with an osteotome.  We placed a corkscrew guide in the femoral head and removed the femoral head in its entirety and found a wide area devoid of cartilage.  We then  placed a bent Hohmann over the  medial acetabular rim and removed remnants of the acetabular labrum and other debris and then began reaming from a size 44 reamer, going up to a size 55 with all reamers under direct visualization, the last reamer under direct fluoroscopy so we could  obtain our depth of reaming, our inclination and anteversion.  I then placed the real DePuy Sector Gription acetabular  component size 56 and a 36+4 neutral polyethylene liner for that size acetabular component.  Attention was then turned to the femur.   With the leg externally rotated to 120 degrees, extended and adducted, we placed a Mueller retractor medially and a Hohmann retractor behind the greater trochanter.  We released the lateral joint capsule and used a box-cutting osteotome to enter the  femoral canal and a rongeur to lateralize.  We then began broaching using Corail broaching system from a size 8 broach, going up to a size 12.  With the size 12 in place, we trialed a standard offset femoral neck and a 36+1.5 hip ball, reduced this in  the acetabulum and it was stable, but we felt like we still needed a little bit more leg length and offset.  We dislocated the hip and removed the trial components.  We placed the real Corail femoral component with varus offset size 12, and we went with  a 36+5 ceramic hip ball, reduced this in the acetabulum, and I was pleased with the leg length, offset, range of motion, and stability.  We then irrigated the soft tissue with normal saline solution using pulsatile lavage.  We were able to close the  joint capsule with interrupted #1 Ethibond suture, followed by a running #1 Vicryl to close the tensor fascia, 0 Vicryl was used to close the deep tissue, 2-0 Vicryl was used to close subcutaneous tissue, and interrupted staples were used on the skin.   Xeroform and an Aquacel dressing was applied.  He was taken off the Hana table, awakened, extubated, and taken to the recovery room in stable condition.  All final counts were correct.  There were no complications noted.  Of note, Rexene Edison, PA-C,  assisted the entire case.  His assistance was crucial for facilitating all aspects of this case.  LN/NUANCE  D:11/03/2018 T:11/03/2018 JOB:[id number]

## 2018-11-04 LAB — CBC
HCT: 40.6 % (ref 39.0–52.0)
Hemoglobin: 13.3 g/dL (ref 13.0–17.0)
MCH: 27.8 pg (ref 26.0–34.0)
MCHC: 32.8 g/dL (ref 30.0–36.0)
MCV: 84.9 fL (ref 80.0–100.0)
Platelets: 192 10*3/uL (ref 150–400)
RBC: 4.78 MIL/uL (ref 4.22–5.81)
RDW: 14.6 % (ref 11.5–15.5)
WBC: 13.8 10*3/uL — ABNORMAL HIGH (ref 4.0–10.5)
nRBC: 0 % (ref 0.0–0.2)

## 2018-11-04 LAB — BASIC METABOLIC PANEL
Anion gap: 6 (ref 5–15)
BUN: 13 mg/dL (ref 6–20)
CO2: 26 mmol/L (ref 22–32)
Calcium: 8.3 mg/dL — ABNORMAL LOW (ref 8.9–10.3)
Chloride: 108 mmol/L (ref 98–111)
Creatinine, Ser: 1.3 mg/dL — ABNORMAL HIGH (ref 0.61–1.24)
GFR calc Af Amer: >60 mL/min (ref >60)
GFR calc non Af Amer: >60 mL/min (ref >60)
Glucose, Bld: 126 mg/dL — ABNORMAL HIGH (ref 70–99)
Potassium: 4.4 mmol/L (ref 3.5–5.1)
Sodium: 140 mmol/L (ref 135–145)

## 2018-11-04 LAB — PROTIME-INR
INR: 3.2 — ABNORMAL HIGH (ref 0.8–1.2)
Prothrombin Time: 32.5 seconds — ABNORMAL HIGH (ref 11.4–15.2)

## 2018-11-04 MED ORDER — WARFARIN 0.5 MG HALF TABLET
0.5000 mg | ORAL_TABLET | Freq: Once | ORAL | Status: DC
  Filled 2018-11-04: qty 1

## 2018-11-04 MED ORDER — OXYCODONE HCL 5 MG PO TABS: 5.0000 mg | ORAL_TABLET | Freq: Four times a day (QID) | ORAL | 0 refills | Status: AC | PRN

## 2018-11-04 NOTE — Plan of Care (Signed)
°  Problem: Education: °Goal: Knowledge of the prescribed therapeutic regimen will improve °Outcome: Progressing °  °Problem: Clinical Measurements: °Goal: Postoperative complications will be avoided or minimized °Outcome: Progressing °  °Problem: Pain Management: °Goal: Pain level will decrease with appropriate interventions °Outcome: Progressing °  °

## 2018-11-04 NOTE — Care Management (Signed)
DC summary faxed to [facility] for home health [phone] Sandford Craze RN,BSN [phone]

## 2018-11-04 NOTE — Plan of Care (Signed)
Pt stable with no needs. No changes to note. Pt hoping to go home today.

## 2018-11-04 NOTE — Progress Notes (Signed)
ANTICOAGULATION CONSULT NOTE  Pharmacy Consult for warfarin Indication: Resuming warfarin post-op, hx VTE  Allergies  Allergen Reactions  . Nsaids Other (See Comments)    Per nephrologist to avoid NSAIDs    Patient Measurements: Height: 5\' 6"  (167.6 cm) Weight: 219 lb 1 oz (99.4 kg) IBW/kg (Calculated) : 63.8  Vital Signs: Temp: 98.7 F (37.1 C) (05/23 0838) Temp Source: Oral (05/23 0404) BP: 119/67 (05/23 0838) Pulse Rate: 81 (05/23 0838)  Labs: Recent Labs    11/03/18 0601 11/04/18 0328  HGB  --  13.3  HCT  --  40.6  PLT  --  192  LABPROT 24.5* 32.5*  INR 2.2* 3.2*  CREATININE  --  1.30*    Estimated Creatinine Clearance: 70 mL/min (A) (by C-G formula based on SCr of 1.3 mg/dL (H)).   Medical History: Past Medical History:  Diagnosis Date  . Arthritis    osteoarthritis-hips, knees  . DVT (deep venous thrombosis) (HCC)    right thigh s/p knee scope surgery  . Gastric ulcer    history of many yrs ago.  Marland Kitchen GERD (gastroesophageal reflux disease)    Protonix controls  . Headache    migraines periodically- once to twice monthly to none in a period of time.  . Hypertension   . Right leg DVT (HCC) 2017   s/p right THA ; was temporarily on xarelto after knee scope dvt, then after right THA was started on coumadin due to multiple dvts in right leg      Assessment: Pharmacy consulted to dose and monitor warfarin in this 58 year old male during his hospital admission. Patient underwent left total hip arthroplasty today (11/03/18). Patient was on warfarin PTA for hx VTE.  Home dose of warfarin: 4 mg PO daily (confirmed with patient) INR goal: 2-3 Last warfarin dose PTA: 11/01/18 (pt reported)  Today, 11/04/18  INR 3.2 is slightly supra-therapeutic; large 8 sec jump in protime  No current medications ordered that significantly interact with warfarin  Labs on 10/26/18: Hgb 16, Plt 234  Hg 13.3, PLTC 192. No bleeding reported    Goal of Therapy:  INR  2-3 Monitor platelets by anticoagulation protocol: Yes   Plan:  Coumadin 0.5 mg po x 1 dose today Daily INR Monitor patient for signs/symptoms of bleeding or thrombosis  Herby Abraham, Pharm.D 11/04/2018 11:19 AM

## 2018-11-04 NOTE — Progress Notes (Signed)
Physical Therapy Treatment Patient Details Name: [name] DandyGregory Clevenger MRN: [id number] DOB: 1961/03/17 Today's Date: 11/04/2018    History of Present Illness 58 yo male s/p L DA-THA on 11/03/18. PMH includes OA, DVT, GERD, HTN, R THA, R knee arthroscopy.    PT Comments    Pt ambulated in hallway and performed LE exercises.  Pt provided with HEP handout.  Pt had no further questions and feels ready for d/c home today.   Follow Up Recommendations  Follow surgeon's recommendation for DC plan and follow-up therapies;Supervision for mobility/OOB(plans for HHPT)     Equipment Recommendations  None recommended by PT    Recommendations for Other Services       Precautions / Restrictions Precautions Precautions: Fall Restrictions Other Position/Activity Restrictions: WBAT    Mobility  Bed Mobility Overal bed mobility: Needs Assistance Bed Mobility: Supine to Sit     Supine to sit: Min guard;HOB elevated     General bed mobility comments: cues for self assist of L LE  Transfers Overall transfer level: Needs assistance Equipment used: Rolling walker (2 wheeled) Transfers: Sit to/from Stand Sit to Stand: Min guard         General transfer comment: min/guard for safety, good hand placement  Ambulation/Gait Ambulation/Gait assistance: Min guard Gait Distance (Feet): 200 Feet Assistive device: Rolling walker (2 wheeled) Gait Pattern/deviations: Step-through pattern;Decreased stride length;Decreased stance time - left;Antalgic     General Gait Details: pt tolerating distance, well, cues for heel strike and step length   Stairs             Wheelchair Mobility    Modified Rankin (Stroke Patients Only)       Balance                                            Cognition Arousal/Alertness: Awake/alert Behavior During Therapy: WFL for tasks assessed/performed Overall Cognitive Status: Within Functional Limits for tasks assessed                                         Exercises Total Joint Exercises Ankle Circles/Pumps: AROM;10 reps;Both Quad Sets: AROM;10 reps;Both Heel Slides: AAROM;10 reps;Supine;Left Hip ABduction/ADduction: AAROM;10 reps;Left;Standing;Supine Long Arc Quad: AROM;10 reps;Seated;Left Knee Flexion: Left;Standing;10 reps;AROM Marching in Standing: AROM;10 reps;Left Standing Hip Extension: AROM;10 reps;Standing;Left    General Comments        Pertinent Vitals/Pain Pain Assessment: 0-10 Pain Score: 7  Pain Location: L hip  Pain Descriptors / Indicators: Discomfort;Aching Pain Intervention(s): RN gave pain meds during session;Monitored during session;Limited activity within patient's tolerance;Repositioned    Home Living                      Prior Function            PT Goals (current goals can now be found in the care plan section) Progress towards PT goals: Progressing toward goals    Frequency    7X/week      PT Plan Current plan remains appropriate    Co-evaluation              AM-PAC PT "6 Clicks" Mobility   Outcome Measure  Help needed turning from your back to your side while in a flat bed without using bedrails?: A Little Help needed  moving from lying on your back to sitting on the side of a flat bed without using bedrails?: A Little Help needed moving to and from a bed to a chair (including a wheelchair)?: A Little Help needed standing up from a chair using your arms (e.g., wheelchair or bedside chair)?: A Little Help needed to walk in hospital room?: A Little Help needed climbing 3-5 steps with a railing? : A Little 6 Click Score: 18    End of Session Equipment Utilized During Treatment: Gait belt Activity Tolerance: Patient tolerated treatment well Patient left: in chair;with call bell/phone within reach   PT Visit Diagnosis: Difficulty in walking, not elsewhere classified (R26.2)     Time: 7579-7282 PT Time Calculation (min) (ACUTE  ONLY): 25 min  Charges:  $Gait Training: 8-22 mins $Therapeutic Exercise: 8-22 mins                     Zenovia Jarred, PT, DPT Acute Rehabilitation Services Office: [phone] Pager: [phone]  Sarajane Jews 11/04/2018, 12:53 PM

## 2018-11-04 NOTE — Discharge Instructions (Signed)

## 2018-11-04 NOTE — Progress Notes (Signed)
Patient ID: [name] [name], male   DOB: 1960/07/21, 58 y.o.   MRN: [id number] Doing well overall.  Vitals, labs, and hip stable.  Can be discharged to home this afternoon.

## 2018-11-04 NOTE — Discharge Summary (Signed)
Patient ID: [name] [name] MRN: [id number] DOB/[name] DandyGE: January 09, 1961 58 y.o.  Admit date: 11/03/2018 Discharge date: 11/04/2018  Admission Diagnoses:  Principal Problem:   Unilateral primary osteoarthritis, left hip Active Problems:   Status post total replacement of left hip   Discharge Diagnoses:  Same  Past Medical History:  Diagnosis Date  . Arthritis    osteoarthritis-hips, knees  . DVT (deep venous thrombosis) (HCC)    right thigh s/p knee scope surgery  . Gastric ulcer    history of many yrs ago.  Marland Kitchen. GERD (gastroesophageal reflux disease)    Protonix controls  . Headache    migraines periodically- once to twice monthly to none in a period of time.  . Hypertension   . 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 male who was admitted 11/03/2018 for operative treatment ofUnilateral primary osteoarthritis, left hip. Patient has severe unremitting pain that affects sleep, daily activities, and work/hobbies. After pre-op clearance the patient was taken to the operating room on 11/03/2018 and underwent  Procedure(s): LEFT TOTAL HIP ARTHROPLASTY ANTERIOR APPROACH.    Patient was given perioperative antibiotics:  Anti-infectives (From admission, onward)   Start     Dose/Rate Route Frequency Ordered Stop   11/03/18 1800  vancomycin (VANCOCIN) IVPB 1000 mg/200 mL premix  Status:  Discontinued     1,000 mg 200 mL/hr over 60 Minutes Intravenous Every 12 hours 11/03/18 1215 11/03/18 1221   11/03/18 1800  vancomycin (VANCOCIN) 1,000 mg in sodium chloride 0.9 % 250 mL IVPB     1,000 mg 250 mL/hr over 60 Minutes Intravenous Every 12 hours 11/03/18 1221 11/03/18 1825   11/03/18  0615  vancomycin (VANCOCIN) IVPB 1000 mg/200 mL premix     1,000 mg 200 mL/hr over 60 Minutes Intravenous  Once 11/03/18 0601 11/03/18 0738   11/03/18 0615  ceFAZolin (ANCEF) IVPB 2g/100 mL premix     2 g 200 mL/hr over 30 Minutes Intravenous On call to O.R. 11/03/18 0601 11/03/18 [id number]       Patient was given sequential compression devices, early ambulation, and chemoprophylaxis to prevent DVT.  Patient benefited maximally from hospital stay and there were no complications.    Recent vital signs:  Patient Vitals for the past 24 hrs:  BP Temp Temp src Pulse Resp SpO2  11/04/18 0838 119/67 98.7 F (37.1 C) - 81 18 99 %  11/04/18 0404 113/62 98.7 F (37.1 C) Oral 82 20 98 %  11/04/18 0049 111/70 99 F (37.2 C) Oral 70 16 96 %  11/03/18 2238 108/70 98.9 F (37.2 C) Oral 74 16 96 %  11/03/18 1857 124/73 99.3 F (37.4 C) Oral 86 14 94 %  11/03/18 1514 118/73 97.9 F (36.6 C) Oral 73 14 96 %  11/03/18 1410 126/69 97.6 F (36.4 C) Oral 74 16 96 %  11/03/18 1315 123/74 98.1 F (36.7 C) Oral 68 14 100 %  11/03/18 1212 135/90 98.5 F (36.9 C) Oral 88 16 100 %  11/03/18 1200 118/76 - - 72 14 100 %  11/03/18 1145 134/75 - - 63 15 100 %  11/03/18 1130 116/71 97.6 F (36.4 C) - 75 13 100 %  Recent laboratory studies:  Recent Labs    11/03/18 0601 11/04/18 0328  WBC  --  13.8*  HGB  --  13.3  HCT  --  40.6  PLT  --  192  NA  --  140  K  --  4.4  CL  --  108  CO2  --  26  BUN  --  13  CREATININE  --  1.30*  GLUCOSE  --  126*  INR 2.2* 3.2*  CALCIUM  --  8.3*     Discharge Medications:   Allergies as of 11/04/2018      Reactions   Nsaids Other (See Comments)   Per nephrologist to avoid NSAIDs      Medication List    TAKE these medications   acetaminophen 500 MG tablet Commonly known as:  TYLENOL Take 1,000 mg by mouth every 6 (six) hours as needed for moderate pain.   cetirizine 10 MG tablet Commonly known as:  ZYRTEC Take 10 mg by mouth daily as needed  for allergies.   cyclobenzaprine 10 MG tablet Commonly known as:  FLEXERIL Take 1 tablet (10 mg total) by mouth 3 (three) times daily as needed for muscle spasms.   losartan 25 MG tablet Commonly known as:  COZAAR Take 25 mg by mouth daily.   omeprazole 20 MG capsule Commonly known as:  PRILOSEC Take 20 mg by mouth daily.   oxyCODONE 5 MG immediate release tablet Commonly known as:  Oxy IR/ROXICODONE Take 1-2 tablets (5-10 mg total) by mouth every 6 (six) hours as needed for moderate pain (pain score 4-6).   rosuvastatin 20 MG tablet Commonly known as:  CRESTOR Take 20 mg by mouth at bedtime.   SUMAtriptan 100 MG tablet Commonly known as:  IMITREX Take 50 mg by mouth every 2 (two) hours as needed for migraine. May repeat in 2 hours if headache persists or recurs.   tamsulosin 0.4 MG Caps capsule Commonly known as:  FLOMAX Take 0.4 mg by mouth daily after supper.   warfarin 2 MG tablet Commonly known as:  COUMADIN Take 4 mg by mouth daily.            Durable Medical Equipment  (From admission, onward)         Start     Ordered   11/03/18 1216  DME Walker rolling  Once    Question:  Patient needs a walker to treat with the following condition  Answer:  Status post total replacement of left hip   11/03/18 1215   11/03/18 1216  DME 3 n 1  Once     11/03/18 1215          Diagnostic Studies: Dg Pelvis Portable  Result Date: 11/03/2018 CLINICAL DATA:  Status post left total hip arthroplasty. EXAM: PORTABLE PELVIS 1-2 VIEWS COMPARISON:  Fluoroscopic images of same day. Radiographs of December 22, 2015. FINDINGS: Interval placement of left total hip arthroplasty. The acetabular and femoral components appear to be well situated. Expected postoperative changes are seen in the surrounding soft tissues. Previously noted right hip arthroplasty is again noted. IMPRESSION: Interval placement of left total hip arthroplasty. Electronically Signed   By: Lupita Raider M.D.   On:  11/03/2018 10:51   Dg C-arm 1-60 Min-no Report  Result Date: 11/03/2018 Fluoroscopy was utilized by the requesting physician.  No radiographic interpretation.   Dg Hip Operative Unilat W Or W/o Pelvis Left  Result Date: 11/03/2018 CLINICAL DATA:  Left hip replacement.  Surgery, elective. EXAM: OPERATIVE left HIP (WITH PELVIS IF PERFORMED) 2 VIEWS TECHNIQUE: Fluoroscopic spot image(s) were submitted for interpretation post-operatively. COMPARISON:  None. FINDINGS: Left total hip arthroplasty is noted. Anterior views demonstrate no radiographic evidence for complication. IMPRESSION: 1. Left total hip arthroplasty without radiographic evidence for complication. Electronically Signed   By: Marin Roberts M.D.   On: 11/03/2018 10:21    Disposition:     Follow-up Information    Kathryne Hitch, MD. Schedule an appointment as soon as possible for a visit in 2 week(s).   Specialty:  Orthopedic Surgery Contact information: [address] [place] [address] [phone]            Signed: Kathryne Hitch 11/04/2018, 11:22 AM

## 2018-11-04 NOTE — Progress Notes (Signed)
Pt stable at time d/c instructions and education given. No needs at this time. Pt reports managed mild pain. Pt has all dme and home health is arranged.

## 2018-11-08 ENCOUNTER — Encounter (HOSPITAL_COMMUNITY): Payer: Self-pay | Admitting: Orthopaedic Surgery

## 2018-11-16 ENCOUNTER — Encounter: Payer: Self-pay | Admitting: Physician Assistant

## 2018-11-16 ENCOUNTER — Ambulatory Visit (INDEPENDENT_AMBULATORY_CARE_PROVIDER_SITE_OTHER): Payer: Managed Care, Other (non HMO) | Admitting: Physician Assistant

## 2018-11-16 ENCOUNTER — Other Ambulatory Visit: Payer: Self-pay

## 2018-11-16 DIAGNOSIS — Z96642 Presence of left artificial hip joint: Secondary | ICD-10-CM

## 2018-11-16 NOTE — Progress Notes (Signed)
Office Visit Note   Patient: [name] DandyGregory Bosque           Date of Birth: 10-Jan-1961           MRN: [id number] Visit Date: 11/16/2018              Requested by: Romeo Rabonaplan, Michael, MD [address] [place], [place] [id number] PCP: Romeo Rabonaplan, Michael, MD   Assessment & Plan: Visit Diagnoses:  1. Status post total replacement of left hip     Plan: Continue to work on range of motion strengthening of the left hip.  Scar tissue mobilization encouraged.  He understands he is unable to submerge the incision.  However he can keep the incision wet in the shower and wash with antibacterial soap.  Follow-Up Instructions: Return in about 4 weeks (around 12/14/2018).   Orders:  No orders of the defined types were placed in this encounter.  No orders of the defined types were placed in this encounter.     Procedures: No procedures performed   Clinical Data: No additional findings.   Subjective: Chief Complaint  Patient presents with  . Left Hip - Pain    HPI Mr. [name] [name] returns today 13 days status post left total hip arthroplasty.  He is overall doing well he is ambulating with a cane.  He is taking occasional oxycodone for pain but mostly using Tylenol.  Again he is on Coumadin for long-term anticoagulation.  Said no chest pain shortness of breath fevers chills.  Overall feels he is progressing well.  Review of Systems  Constitutional: Negative for chills and fever.  Respiratory: Negative for shortness of breath.   Cardiovascular: Negative for chest pain.     Objective: Vital Signs: There were no vitals taken for this visit.  Physical Exam Constitutional:      Appearance: He is not ill-appearing or diaphoretic.  Pulmonary:     Effort: Pulmonary effort is normal.  Neurological:     Mental Status: He is alert and oriented to person, place, and time.  Psychiatric:        Mood and Affect: Mood normal.        Behavior: Behavior normal.     Ortho Exam Left hip surgical  incisions well approximated with staples no signs of infection.  Left calf supple nontender.  Dorsiflexion plantarflexion ankle intact.  Ambulates with cane with slight antalgic gait. Specialty Comments:  No specialty comments available.  Imaging: No results found.   PMFS History: Patient Active Problem List   Diagnosis Date Noted  . Unilateral primary osteoarthritis, left hip 11/03/2018  . Status post total replacement of left hip 11/03/2018  . Osteoarthritis of right hip 12/05/2015  . 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 Arthritis    osteoarthritis-hips, knees  . DVT (deep venous thrombosis) (HCC)    right thigh s/p knee scope surgery  . Gastric ulcer    history of many yrs ago.  Marland Kitchen. GERD (gastroesophageal reflux disease)    Protonix controls  . Headache    migraines periodically- once to twice monthly to none in a period of time.  . Hypertension   . Right leg DVT (HCC) 2017   s/p right THA ; was temporarily on xarelto after knee scope dvt, then after right THA was started on coumadin due to multiple dvts in right leg      History reviewed. No pertinent family history.  Past Surgical History:  Procedure Laterality Date  . KNEE ARTHROSCOPY Right    knee scope surgery  . TOTAL HIP ARTHROPLASTY Right 12/05/2015   Procedure: RIGHT TOTAL HIP ARTHROPLASTY ANTERIOR APPROACH;  Surgeon: Kathryne Hitch, MD;  Location: [facility];  Service: Orthopedics;  Laterality: Right;  . TOTAL HIP ARTHROPLASTY Left 11/03/2018   Procedure: LEFT TOTAL HIP ARTHROPLASTY ANTERIOR APPROACH;  Surgeon: Kathryne Hitch, MD;  Location: [facility];  Service: Orthopedics;  Laterality: Left;   Social History   Occupational History  . Not on file  Tobacco Use  . Smoking status: Never Smoker  . Smokeless tobacco: Never Used  Substance and Sexual Activity  . Alcohol use: Yes    Comment: occ   . Drug use: No  . Sexual activity: Not on  file

## 2018-12-14 ENCOUNTER — Encounter: Payer: Self-pay | Admitting: Orthopaedic Surgery

## 2018-12-14 ENCOUNTER — Other Ambulatory Visit: Payer: Self-pay

## 2018-12-14 ENCOUNTER — Ambulatory Visit (INDEPENDENT_AMBULATORY_CARE_PROVIDER_SITE_OTHER): Payer: Managed Care, Other (non HMO) | Admitting: Orthopaedic Surgery

## 2018-12-14 DIAGNOSIS — G8929 Other chronic pain: Secondary | ICD-10-CM | POA: Diagnosis not present

## 2018-12-14 DIAGNOSIS — M25562 Pain in left knee: Secondary | ICD-10-CM

## 2018-12-14 MED ORDER — METHYLPREDNISOLONE ACETATE 40 MG/ML IJ SUSP
40.0000 mg | INTRAMUSCULAR | Status: AC | PRN
  Administered 2018-12-14: 40 mg via INTRA_ARTICULAR

## 2018-12-14 MED ORDER — LIDOCAINE HCL 1 % IJ SOLN
3.0000 mL | INTRAMUSCULAR | Status: AC | PRN
  Administered 2018-12-14: 3 mL
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
## 2018-12-25 ENCOUNTER — Ambulatory Visit: Payer: Managed Care, Other (non HMO) | Admitting: Orthopaedic Surgery

## 2019-01-03 ENCOUNTER — Encounter: Payer: Self-pay | Admitting: Orthopaedic Surgery

## 2019-01-03 ENCOUNTER — Other Ambulatory Visit: Payer: Self-pay

## 2019-01-03 ENCOUNTER — Ambulatory Visit (INDEPENDENT_AMBULATORY_CARE_PROVIDER_SITE_OTHER): Payer: Managed Care, Other (non HMO) | Admitting: Orthopaedic Surgery

## 2019-01-03 ENCOUNTER — Ambulatory Visit (INDEPENDENT_AMBULATORY_CARE_PROVIDER_SITE_OTHER): Payer: Managed Care, Other (non HMO)

## 2019-01-03 DIAGNOSIS — M25562 Pain in left knee: Secondary | ICD-10-CM

## 2019-01-03 DIAGNOSIS — G8929 Other chronic pain: Secondary | ICD-10-CM | POA: Diagnosis not present

## 2019-01-03 DIAGNOSIS — M25552 Pain in left hip: Secondary | ICD-10-CM

## 2019-01-03 DIAGNOSIS — Z96642 Presence of left artificial hip joint: Secondary | ICD-10-CM
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
## 2019-01-08 ENCOUNTER — Ambulatory Visit: Payer: Managed Care, Other (non HMO) | Admitting: Orthopaedic Surgery

## 2019-01-24 ENCOUNTER — Ambulatory Visit: Payer: Managed Care, Other (non HMO) | Admitting: Orthopaedic Surgery

## 2021-03-19 IMAGING — DX PORTABLE PELVIS 1-2 VIEWS
1 series · 1 of 1 positions shown · non-contrast
Comparison: Fluoroscopic images of same day. Radiographs of December 22, 2015.

CLINICAL DATA: Status post left total hip arthroplasty.

EXAM:
PORTABLE PELVIS 1-2 VIEWS

[pelvis ap]
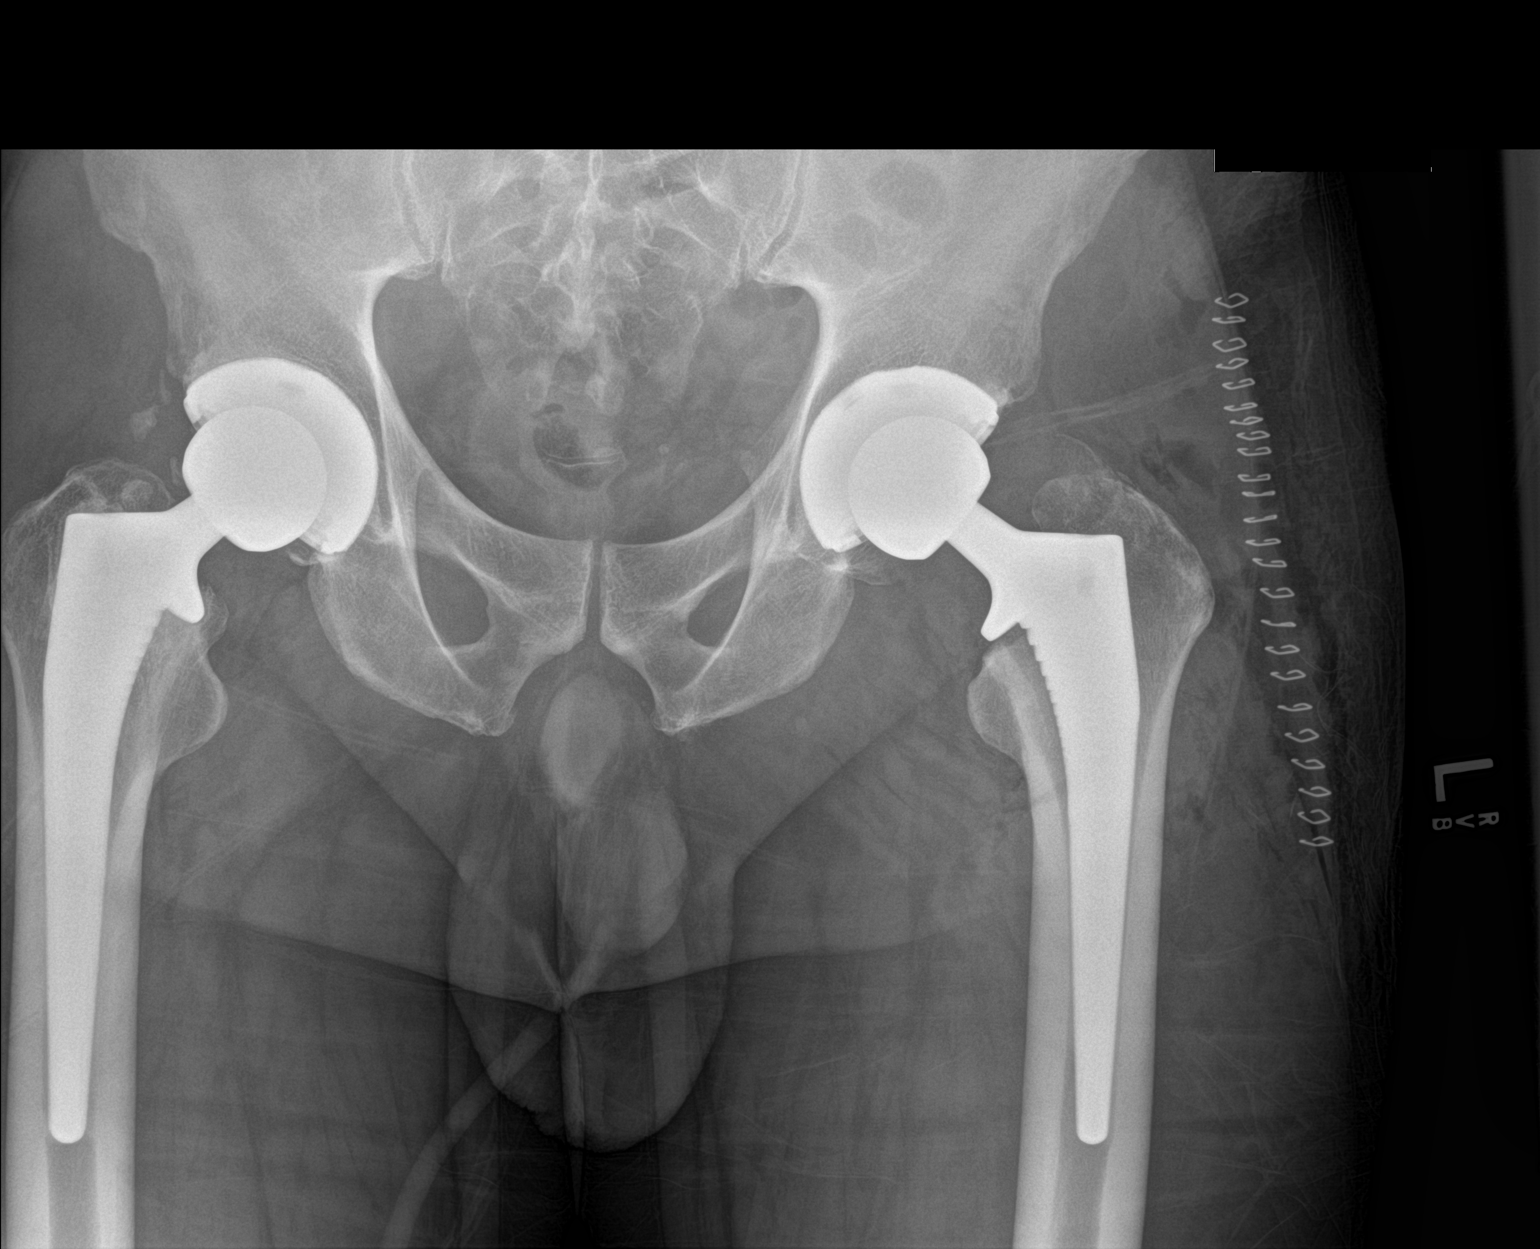

[1 of 1 positions shown; findings below may reference images not displayed]

FINDINGS: Interval placement of left total hip arthroplasty. The acetabular
and femoral components appear to be well situated. Expected
postoperative changes are seen in the surrounding soft tissues.
Previously noted right hip arthroplasty is again noted.
IMPRESSION: Interval placement of left total hip arthroplasty.

## 2021-03-19 IMAGING — RF OPERATIVE LEFT HIP WITH PELVIS
1 series · 2 of 2 positions shown · non-contrast
Comparison: None.

CLINICAL DATA: Left hip replacement.  Surgery, elective.

EXAM:
OPERATIVE left HIP (WITH PELVIS IF PERFORMED) 2 VIEWS
TECHNIQUE: Fluoroscopic spot image(s) were submitted for interpretation
post-operatively.

[Series 1: unknown protocol · 0.20mm/px · 2 of 2 slices shown]
[im 1/2]
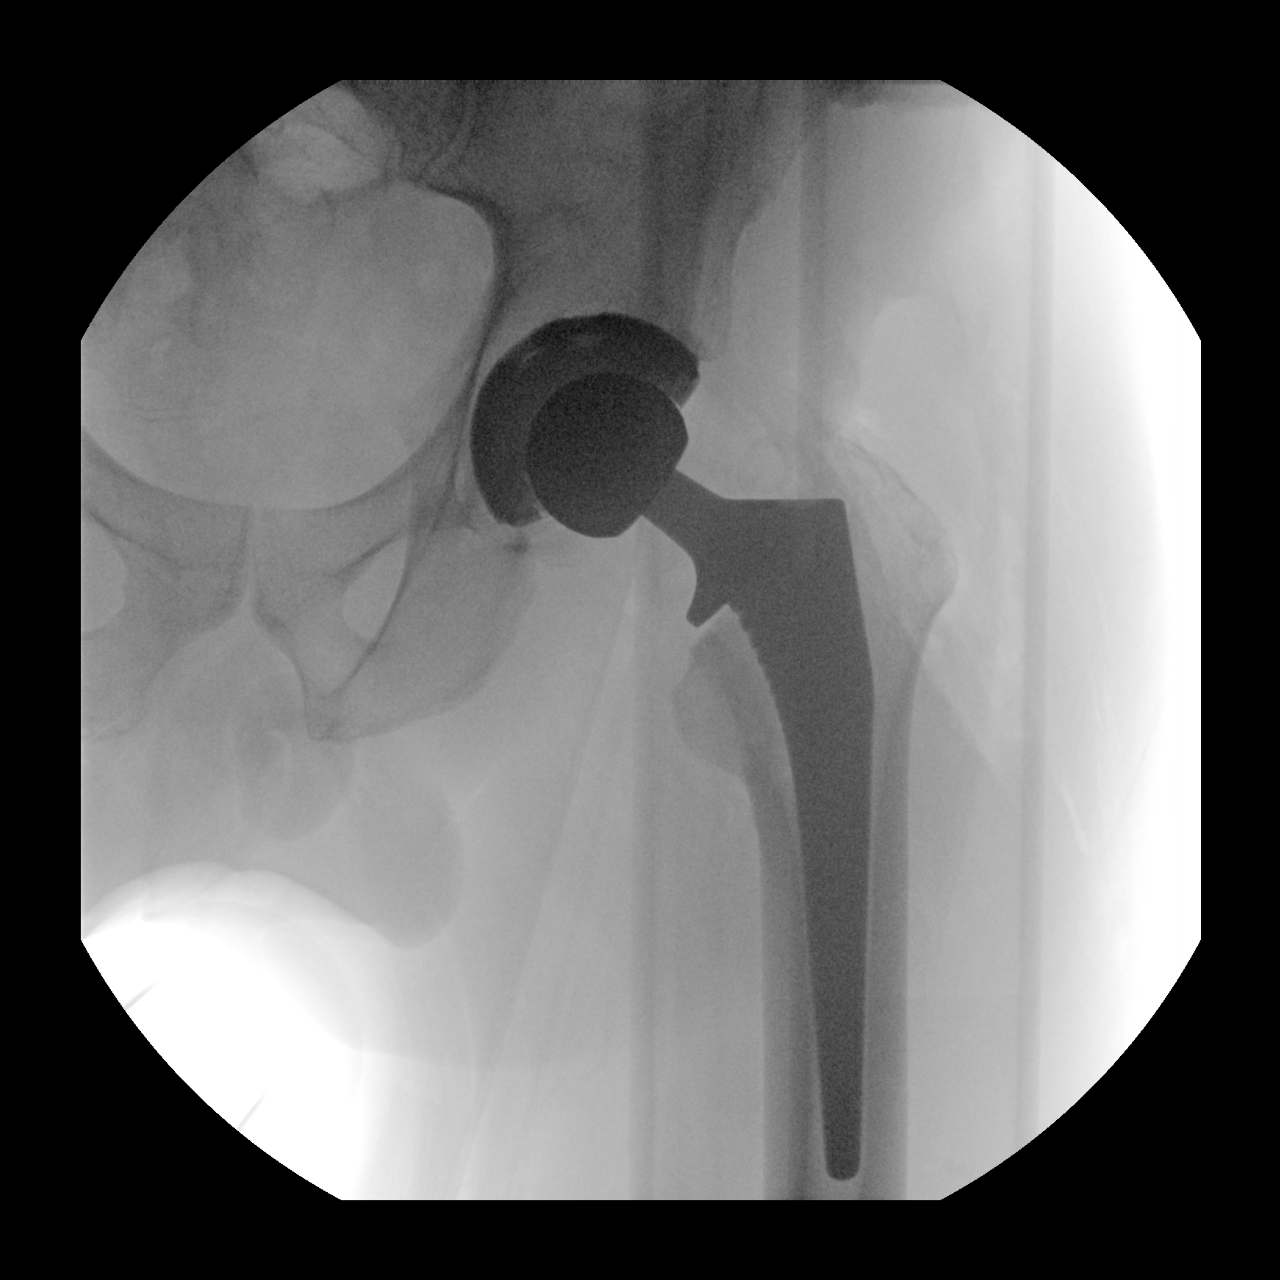
[im 2/2]
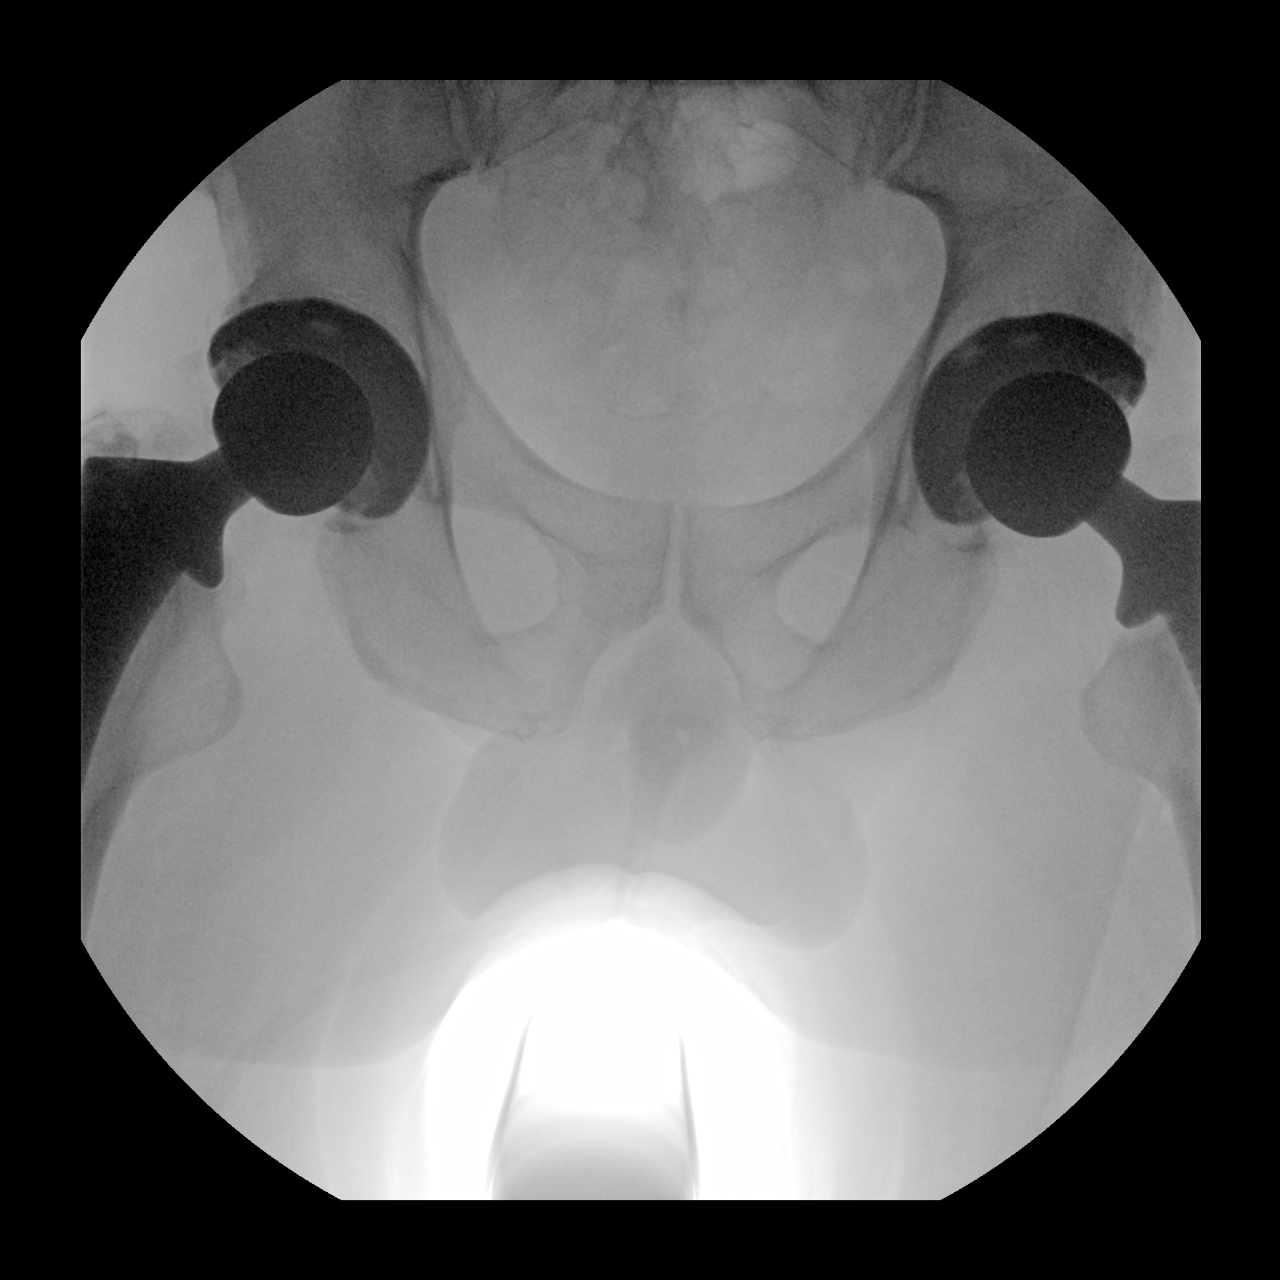

[2 of 2 positions shown; findings below may reference images not displayed]

FINDINGS: Left total hip arthroplasty is noted. Anterior views demonstrate no
radiographic evidence for complication.
IMPRESSION: 1. Left total hip arthroplasty without radiographic evidence for
complication.
